# Patient Record
Sex: Female | Born: 1985 | Race: White | Hispanic: No | Smoking: Never smoker
Health system: Southern US, Community
[De-identification: ages and names within clinical notes are randomized; demographics above are authoritative.]

## PROBLEM LIST (undated history)

## (undated) DIAGNOSIS — C439 Malignant melanoma of skin, unspecified: Secondary | ICD-10-CM

## (undated) DIAGNOSIS — Q796 Ehlers-Danlos syndrome, unspecified: Secondary | ICD-10-CM

## (undated) DIAGNOSIS — F429 Obsessive-compulsive disorder, unspecified: Secondary | ICD-10-CM

## (undated) DIAGNOSIS — L80 Vitiligo: Secondary | ICD-10-CM

## (undated) DIAGNOSIS — E079 Disorder of thyroid, unspecified: Secondary | ICD-10-CM

## (undated) DIAGNOSIS — F32A Depression, unspecified: Secondary | ICD-10-CM

## (undated) HISTORY — PX: SKIN CANCER EXCISION: SHX779

## (undated) HISTORY — PX: REVISION OF SCAR ON FACE/HEAD: SHX2349

## (undated) HISTORY — DX: Vitiligo: L80

## (undated) HISTORY — PX: GALLBLADDER SURGERY: SHX652

## (undated) HISTORY — DX: Malignant melanoma of skin, unspecified: C43.9

## (undated) HISTORY — DX: Ehlers-Danlos syndrome, unspecified: Q79.60

## (undated) HISTORY — PX: FOOT SURGERY: SHX648

## (undated) HISTORY — DX: Obsessive-compulsive disorder, unspecified: F42.9

## (undated) HISTORY — PX: ABLATION: SHX5711

---

## 2017-01-15 ENCOUNTER — Ambulatory Visit
Admission: RE | Admit: 2017-01-15 | Discharge: 2017-01-15 | Disposition: A | Discharge status: Home / Self Care | Payer: BC Managed Care – PPO | Source: Ambulatory Visit | Attending: Family | Admitting: Family

## 2017-01-15 ENCOUNTER — Other Ambulatory Visit: Payer: Self-pay | Admitting: Family

## 2017-01-15 DIAGNOSIS — C439 Malignant melanoma of skin, unspecified: Secondary | ICD-10-CM

## 2018-06-20 ENCOUNTER — Other Ambulatory Visit: Payer: Self-pay | Admitting: Endocrinology

## 2018-06-20 DIAGNOSIS — E039 Hypothyroidism, unspecified: Secondary | ICD-10-CM

## 2018-06-21 ENCOUNTER — Other Ambulatory Visit: Payer: Self-pay | Admitting: Endocrinology

## 2018-07-02 ENCOUNTER — Ambulatory Visit
Admission: RE | Admit: 2018-07-02 | Discharge: 2018-07-02 | Disposition: A | Discharge status: Home / Self Care | Payer: BC Managed Care – PPO | Source: Ambulatory Visit | Attending: Endocrinology | Admitting: Endocrinology

## 2018-07-02 DIAGNOSIS — E039 Hypothyroidism, unspecified: Secondary | ICD-10-CM

## 2019-10-15 ENCOUNTER — Other Ambulatory Visit: Payer: Self-pay

## 2019-10-15 DIAGNOSIS — Z20822 Contact with and (suspected) exposure to covid-19: Secondary | ICD-10-CM

## 2019-10-16 LAB — NOVEL CORONAVIRUS, NAA: SARS-CoV-2, NAA: NOT DETECTED

## 2020-01-21 DIAGNOSIS — Q796 Ehlers-Danlos syndrome, unspecified: Secondary | ICD-10-CM | POA: Insufficient documentation

## 2020-06-09 ENCOUNTER — Ambulatory Visit: Payer: BC Managed Care – PPO | Attending: Internal Medicine

## 2020-06-09 DIAGNOSIS — Z20822 Contact with and (suspected) exposure to covid-19: Secondary | ICD-10-CM

## 2020-06-10 LAB — NOVEL CORONAVIRUS, NAA: SARS-CoV-2, NAA: NOT DETECTED

## 2020-06-10 LAB — SARS-COV-2, NAA 2 DAY TAT

## 2020-06-24 ENCOUNTER — Other Ambulatory Visit: Payer: BC Managed Care – PPO

## 2021-07-09 ENCOUNTER — Emergency Department (HOSPITAL_COMMUNITY): Payer: BC Managed Care – PPO

## 2021-07-09 ENCOUNTER — Other Ambulatory Visit: Payer: Self-pay

## 2021-07-09 ENCOUNTER — Encounter (HOSPITAL_COMMUNITY): Payer: Self-pay | Admitting: Emergency Medicine

## 2021-07-09 ENCOUNTER — Emergency Department (HOSPITAL_COMMUNITY)
Admission: EM | Admit: 2021-07-09 | Discharge: 2021-07-09 | Disposition: A | Discharge status: Home / Self Care | Payer: BC Managed Care – PPO | Attending: Emergency Medicine | Admitting: Emergency Medicine

## 2021-07-09 DIAGNOSIS — R202 Paresthesia of skin: Secondary | ICD-10-CM | POA: Diagnosis not present

## 2021-07-09 DIAGNOSIS — M542 Cervicalgia: Secondary | ICD-10-CM | POA: Diagnosis not present

## 2021-07-09 DIAGNOSIS — G44319 Acute post-traumatic headache, not intractable: Secondary | ICD-10-CM | POA: Diagnosis not present

## 2021-07-09 DIAGNOSIS — W208XXA Other cause of strike by thrown, projected or falling object, initial encounter: Secondary | ICD-10-CM | POA: Diagnosis not present

## 2021-07-09 DIAGNOSIS — R42 Dizziness and giddiness: Secondary | ICD-10-CM | POA: Diagnosis present

## 2021-07-09 HISTORY — DX: Depression, unspecified: F32.A

## 2021-07-09 HISTORY — DX: Disorder of thyroid, unspecified: E07.9

## 2021-07-09 LAB — CBC WITH DIFFERENTIAL/PLATELET
Abs Immature Granulocytes: 0.06 10*3/uL (ref 0.00–0.07)
Basophils Absolute: 0 10*3/uL (ref 0.0–0.1)
Basophils Relative: 0 %
Eosinophils Absolute: 0.1 10*3/uL (ref 0.0–0.5)
Eosinophils Relative: 1 %
HCT: 44.4 % (ref 36.0–46.0)
Hemoglobin: 15 g/dL (ref 12.0–15.0)
Immature Granulocytes: 0 %
Lymphocytes Relative: 21 %
Lymphs Abs: 3.2 10*3/uL (ref 0.7–4.0)
MCH: 30.9 pg (ref 26.0–34.0)
MCHC: 33.8 g/dL (ref 30.0–36.0)
MCV: 91.5 fL (ref 80.0–100.0)
Monocytes Absolute: 0.8 10*3/uL (ref 0.1–1.0)
Monocytes Relative: 6 %
Neutro Abs: 11 10*3/uL — ABNORMAL HIGH (ref 1.7–7.7)
Neutrophils Relative %: 72 %
Platelets: 417 10*3/uL — ABNORMAL HIGH (ref 150–400)
RBC: 4.85 MIL/uL (ref 3.87–5.11)
RDW: 12.8 % (ref 11.5–15.5)
WBC: 15.3 10*3/uL — ABNORMAL HIGH (ref 4.0–10.5)
nRBC: 0 % (ref 0.0–0.2)

## 2021-07-09 LAB — BASIC METABOLIC PANEL
Anion gap: 11 (ref 5–15)
BUN: 9 mg/dL (ref 6–20)
CO2: 22 mmol/L (ref 22–32)
Calcium: 9 mg/dL (ref 8.9–10.3)
Chloride: 104 mmol/L (ref 98–111)
Creatinine, Ser: 0.84 mg/dL (ref 0.44–1.00)
GFR, Estimated: >60 mL/min (ref >60)
Glucose, Bld: 129 mg/dL — ABNORMAL HIGH (ref 70–99)
Potassium: 3.5 mmol/L (ref 3.5–5.1)
Sodium: 137 mmol/L (ref 135–145)

## 2021-07-09 LAB — I-STAT BETA HCG BLOOD, ED (MC, WL, AP ONLY): I-stat hCG, quantitative: <5 m[IU]/mL (ref <5)

## 2021-07-09 MED ORDER — DIPHENHYDRAMINE HCL 25 MG PO CAPS
50.0000 mg | ORAL_CAPSULE | Freq: Once | ORAL | Status: AC
  Administered 2021-07-09: 50 mg via ORAL
  Filled 2021-07-09: qty 2

## 2021-07-09 MED ORDER — METOCLOPRAMIDE HCL 5 MG/ML IJ SOLN
10.0000 mg | Freq: Once | INTRAMUSCULAR | Status: AC
  Administered 2021-07-09: 10 mg via INTRAVENOUS
  Filled 2021-07-09: qty 2

## 2021-07-09 MED ORDER — SODIUM CHLORIDE 0.9 % IV BOLUS
1000.0000 mL | Freq: Once | INTRAVENOUS | Status: AC
  Administered 2021-07-09: 1000 mL via INTRAVENOUS

## 2021-07-09 MED ORDER — KETOROLAC TROMETHAMINE 15 MG/ML IJ SOLN
15.0000 mg | Freq: Once | INTRAMUSCULAR | Status: AC
  Administered 2021-07-09: 15 mg via INTRAVENOUS
  Filled 2021-07-09: qty 1

## 2021-07-09 MED ORDER — DEXAMETHASONE SODIUM PHOSPHATE 10 MG/ML IJ SOLN
10.0000 mg | Freq: Once | INTRAMUSCULAR | Status: AC
  Administered 2021-07-09: 10 mg via INTRAVENOUS
  Filled 2021-07-09: qty 1

## 2021-07-09 NOTE — ED Provider Notes (Signed)
Emergency Medicine Provider Triage Evaluation Note  April Salas , a 35 y.o. female  was evaluated in triage.  Pt complains of headache. Box of mason jars fell onto head on Thursday. Friday developed headache. "Off vision" to Bl eyes since. Tingling to BL arms L>R. No weakness. Neck went backwards during the injury. Feels like she is walking to the side. Recently completed abx of tonsillitis. No fever, neck rigidity, emesis. No hx of MS. Hx of thyroid and Ehlers danlos  Review of Systems  Positive: Headache, neck pain Negative: Cp, eesis  Physical Exam  BP (!) 123/92 (BP Location: Left Arm)   Pulse (!) 109   Temp 100 F (37.8 C) (Oral)   Resp 18   SpO2 96%  Gen:   Awake, no distress   Resp:  Normal effort  MSK:   Moves extremities without difficulty  Neuro:  Cn 2-12 grossly intact, equal strength, intact sensation  Other:    Medical Decision Making  Medically screening exam initiated at 3:04 PM.  Appropriate orders placed.  Adylee Lovern was informed that the remainder of the evaluation will be completed by another provider, this initial triage assessment does not replace that evaluation, and the importance of remaining in the ED until their evaluation is complete.  Headache, weakness, numbness  Not a code stroke, No LVO criteria   Hughes Wyndham A, PA-C 07/09/21 1507    Wyvonnia Dusky, MD 07/10/21 [phone]

## 2021-07-09 NOTE — ED Triage Notes (Signed)
Pt states mason jars fell on her head on Thursday night.  Reports headache and "fuzzy" vision since Friday.  Also reports L arm tingling.  No arm drift or change in sensation.

## 2021-07-09 NOTE — Discharge Instructions (Addendum)
Take Tylenol and/or ibuprofen as needed for pain. Avoid things that make your head worse, brain rest is the best thing you can do for your head right now. Follow-up with your primary care doctor for recheck of your symptoms. Return to the emergency room if develop severe worsening pain, vision changes, slurred speech, numbness, any new or worsening, concerning symptoms.

## 2021-07-09 NOTE — ED Notes (Signed)
Pt and her partner informed this nurse that pt does not want to have any more CT scans. Provider notified

## 2021-07-09 NOTE — ED Provider Notes (Signed)
[facility] EMERGENCY DEPARTMENT Provider Note   CSN: [id number] Arrival date & time: 07/09/21  1435     History No chief complaint on file.   April Salas is a 35 y.o. female presenting for evaluation of headache, difficulty focusing, dizziness, and tingling.  Patient states 2 days ago she was hit in the head with a heavy basket containing mason jars.  She had a mild headache at that time.  Yesterday her headache significantly worsened.  She has associated photophobia and states it is hard for her eyes to focus.  She reports feeling like both arms are weak and Jell-O like, with worsening symptoms on the left side.  She has pain in the upper aspect of her neck, but no pain in the lower part of the back.  No difficulty walking.  She has taken Motrin with mild improvement of headache, last dose was early this morning.  She is not on blood thinners.  Takes thyroid medication and depression medication daily.  She is currently being treated for mono and a tonsillar infection, on her last day of antibiotics.  HPI     Past Medical History:  Diagnosis Date   Depression    Thyroid disease     There are no problems to display for this patient.   History reviewed. No pertinent surgical history.   OB History   No obstetric history on file.     No family history on file.  Social History   Tobacco Use   Smoking status: Never   Smokeless tobacco: Never  Substance Use Topics   Alcohol use: Not Currently   Drug use: Not Currently    Home Medications Prior to Admission medications   Medication Sig Start Date End Date Taking? Authorizing Provider  azithromycin (ZITHROMAX) 250 MG tablet Take by mouth. 06/29/21   [provider]  clonazePAM (KLONOPIN) 0.5 MG tablet Take 0.5 mg by mouth 2 (two) times daily as needed. 06/10/21   [provider]  DULoxetine (CYMBALTA) 30 MG capsule Take by mouth. 06/13/21   [provider]  fluconazole  (DIFLUCAN) 150 MG tablet Take 150 mg by mouth daily. 07/05/21   [provider]  levothyroxine (SYNTHROID) 125 MCG tablet Take 125 mcg by mouth every morning. 06/01/21   [provider]  meloxicam (MOBIC) 15 MG tablet Take by mouth. 05/05/21   [provider]  Vitamin D, Ergocalciferol, (DRISDOL) 1.25 MG (50000 UNIT) CAPS capsule Take by mouth. 01/13/21   [provider]    Allergies    Patient has no allergy information on record.  Review of Systems   Review of Systems  Eyes:  Positive for photophobia and visual disturbance.  Neurological:  Positive for dizziness, weakness and headaches.  All other systems reviewed and are negative.  Physical Exam Updated Vital Signs BP 111/76   Pulse 75   Temp 100 F (37.8 C) (Oral)   Resp 16   SpO2 97%   Physical Exam Vitals and nursing note reviewed.  Constitutional:      General: She is not in acute distress.    Appearance: Normal appearance.     Comments: Appears nontoxic.  HENT:     Head: Normocephalic and atraumatic.  Eyes:     Conjunctiva/sclera: Conjunctivae normal.     Pupils: Pupils are equal, round, and reactive to light.     Comments: EOMI and PERRLA.  Photophobia noted.  Neck:     Comments: Tenderness palpation in the superior aspect of  the C-spine.  Full active range of motion of the head.  Mild discomfort with palpation of bilateral paracervical muscles. Cardiovascular:     Rate and Rhythm: Normal rate and regular rhythm.     Pulses: Normal pulses.  Pulmonary:     Effort: Pulmonary effort is normal. No respiratory distress.     Breath sounds: Normal breath sounds. No wheezing.     Comments: Speaking in full sentences.  Clear lung sounds in all fields. Abdominal:     General: There is no distension.     Palpations: Abdomen is soft. There is no mass.     Tenderness: There is no abdominal tenderness. There is no guarding or rebound.  Musculoskeletal:        General: Normal range of motion.      Cervical back: Normal range of motion and neck supple. Tenderness present.     Comments: Strength intact x4.  Skin:    General: Skin is warm and dry.     Capillary Refill: Capillary refill takes less than 2 seconds.  Neurological:     Mental Status: She is alert and oriented to person, place, and time.     GCS: GCS eye subscore is 4. GCS verbal subscore is 5. GCS motor subscore is 6.     Cranial Nerves: Cranial nerves are intact.     Motor: Motor function is intact.     Coordination: Finger-Nose-Finger Test normal.     Comments: CN intact.  Nose to finger intact.  Strength intact.  Patient reports abnormal sensation of the left hand and distal arm, equal sensation of the proximal arm  Psychiatric:        Mood and Affect: Mood and affect normal.        Speech: Speech normal.        Behavior: Behavior normal.    ED Results / Procedures / Treatments   Labs (all labs ordered are listed, but only abnormal results are displayed) Labs Reviewed  CBC WITH DIFFERENTIAL/PLATELET - Abnormal; Notable for the following components:      Result Value   WBC 15.3 (*)    Platelets 417 (*)    Neutro Abs 11.0 (*)    All other components within normal limits  BASIC METABOLIC PANEL - Abnormal; Notable for the following components:   Glucose, Bld 129 (*)    All other components within normal limits  I-STAT BETA HCG BLOOD, ED ([facility], [facility], [facility] ONLY)    EKG None  Radiology CT Head Wo Contrast  Result Date: 07/09/2021 CLINICAL DATA:  Headache and visual disturbance after mason jars fell on her head nights ago. EXAM: CT HEAD WITHOUT CONTRAST TECHNIQUE: Contiguous axial images were obtained from the base of the skull through the vertex without intravenous contrast. COMPARISON:  None. FINDINGS: Brain: Normal appearing cerebral hemispheres and posterior fossa structures. Normal size and position of the ventricles. No intracranial hemorrhage, mass lesion or CT evidence of acute infarction. Vascular: No  hyperdense vessel or unexpected calcification. Skull: Normal. Negative for fracture or focal lesion. Sinuses/Orbits: Mild inferior left maxillary sinus retention cyst formation. Unremarkable orbits. Other: None. IMPRESSION: No acute abnormality. Electronically Signed   By: Claudie Revering M.D.   On: 07/09/2021 15:54    Procedures Procedures   Medications Ordered in ED Medications  ketorolac (TORADOL) 15 MG/ML injection 15 mg (15 mg Intravenous Given 07/09/21 1709)  metoCLOPramide (REGLAN) injection 10 mg (10 mg Intravenous Given 07/09/21 1714)  dexamethasone (DECADRON) injection 10 mg (10 mg Intravenous  Given 07/09/21 1711)  diphenhydrAMINE (BENADRYL) capsule 50 mg (50 mg Oral Given 07/09/21 1717)  sodium chloride 0.9 % bolus 1,000 mL (0 mLs Intravenous Stopped 07/09/21 1824)    ED Course  I have reviewed the triage vital signs and the nursing notes.  Pertinent labs & imaging results that were available during my care of the patient were reviewed by me and considered in my medical decision making (see chart for details).    MDM Rules/Calculators/A&P                           Patient presenting for evaluation of headache and neck pain after a head injury a few days ago.  On exam, patient peers nontoxic.  She has no focal weakness.  She does report slight decrease sensation of the distal left arm.  However exam is not consistent with stroke, doubt LVO.  She does have neck tenderness, as such, obtain CT of the neck.  CT head obtained from triage negative for acute findings.  Labs overall reassuring.  There is mild leukocytosis, however patient is currently being treated for tonsil infection, likely contributing to this.  Heart rate is minimally elevated, likely due to low-grade fever of 100, dehydration, and pain.  After headache cocktail, heart rate has improved.  Patient is refusing CT of the neck, states she does not feel there is anything wrong with her neck.  I discussed risks including a  unstable C-spine fracture that could lead to paralysis, patient states he understands but does not want the CT.  I encouraged her to follow-up or return if she has worsening pain, numbness, or weakness.  Discussed symptoms of a concussion/posttraumatic headache.  Discussed brain rest.  At this time, patient appears safe for discharge.  Return precautions given.  Patient states she understands and agrees to plan.  Final Clinical Impression(s) / ED Diagnoses Final diagnoses:  Acute post-traumatic headache, not intractable    Rx / DC Orders ED Discharge Orders     None        Franchot Heidelberg, PA-C 07/09/21 1847    Fredia Sorrow, MD 07/14/21 1244

## 2021-07-29 DIAGNOSIS — B2790 Infectious mononucleosis, unspecified without complication: Secondary | ICD-10-CM | POA: Insufficient documentation

## 2021-07-29 HISTORY — DX: Infectious mononucleosis, unspecified without complication: B27.90

## 2021-08-18 ENCOUNTER — Telehealth: Payer: Self-pay | Admitting: Family Medicine

## 2021-09-22 ENCOUNTER — Encounter: Payer: BC Managed Care – PPO | Admitting: Family Medicine

## 2021-10-12 ENCOUNTER — Ambulatory Visit (INDEPENDENT_AMBULATORY_CARE_PROVIDER_SITE_OTHER): Payer: BC Managed Care – PPO | Admitting: Family Medicine

## 2021-10-12 ENCOUNTER — Encounter: Payer: Self-pay | Admitting: Family Medicine

## 2021-10-12 ENCOUNTER — Other Ambulatory Visit: Payer: Self-pay

## 2021-10-12 VITALS — BP 131/82 | HR 74 | Ht 66.0 in | Wt 226.0 lb

## 2021-10-12 DIAGNOSIS — M2550 Pain in unspecified joint: Secondary | ICD-10-CM

## 2021-10-12 DIAGNOSIS — Z803 Family history of malignant neoplasm of breast: Secondary | ICD-10-CM | POA: Diagnosis not present

## 2021-10-12 DIAGNOSIS — R102 Pelvic and perineal pain: Secondary | ICD-10-CM

## 2021-10-12 NOTE — Progress Notes (Signed)
Had a pap done in august at [facility]

## 2021-10-12 NOTE — Progress Notes (Signed)
   GYNECOLOGY PROBLEM  VISIT ENCOUNTER NOTE  Subjective:   April Salas is a 35 y.o. G1P0010 adult here for a problem GYN visit.  Current complaints:  Reports a longstanding right vaginal wall pain. Prior providers have recommend pelvic floor PT.    We reviewed in detail some of the experiences at the prior GYN provider office in terms of non-gender affirming care and also other BMI-shaming techniques and these prompted the change to our facility.   Denies abnormal vaginal bleeding but did have heavier cycles prior to ablation. Reports prostaglandin surge at typical menses time point which flares her Ehlers-Danlos.  Denies discharge, pelvic pain, problems with intercourse or other gynecologic concerns.    Patient is sexually active with non- sperm producing partner and has had ablation for bleeding control  Reviewed in a limited capacity change to mood stabilizing medication and some possible nerve overactivity. Recently increase duloxetine. Recommending reaching out to provider to discuss.   Gynecologic History No LMP recorded. Patient has had an ablation. Contraception: none  Health Maintenance Due  Topic Date Due   Pneumococcal Vaccine 66-18 Years old (1 - PCV) Never done   HIV Screening  Never done   Hepatitis C Screening  Never done   PAP SMEAR-Modifier  Never done    The following portions of the patient's history were reviewed and updated as appropriate: allergies, current medications, past family history, past medical history, past social history, past surgical history and problem list.  Review of Systems Pertinent items are noted in HPI.   Objective:  BP 131/82   Pulse 74   Ht 5\' 6"  (1.676 m)   Wt 226 lb (102.5 kg)   BMI 36.48 kg/m  Gen: well appearing, NAD HEENT: no scleral icterus CV: RR Lung: Normal WOB Ext: warm well perfused  PELVIC: not indicated   Assessment and Plan:   1. 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  Agrees to referral - Ambulatory referral to Physical Therapy  2. Family history of breast cancer Discussed EMPOWER panel testing and strongly desires. Multiple paternal side breast cancers (none 1st degree) and mother is adopted thus unsure of this side. Also patient has had melanoma - Genetic Screening  3. Joint Pain - Reviewed sx that center around prior menstrual cycle.  - Discussed Period Repair Manual by Montel Clock as possibly helpful   Please refer to After Visit Summary for other counseling recommendations.   Return if symptoms worsen or fail to improve.  Caren Macadam, MD, MPH, ABFM Attending [place] for [facility]

## 2021-10-14 ENCOUNTER — Encounter: Payer: Self-pay | Admitting: Family Medicine

## 2021-10-14 DIAGNOSIS — F4310 Post-traumatic stress disorder, unspecified: Secondary | ICD-10-CM | POA: Insufficient documentation

## 2021-10-14 DIAGNOSIS — E063 Autoimmune thyroiditis: Secondary | ICD-10-CM | POA: Insufficient documentation

## 2021-10-14 DIAGNOSIS — E559 Vitamin D deficiency, unspecified: Secondary | ICD-10-CM | POA: Insufficient documentation

## 2021-10-14 DIAGNOSIS — D0330 Melanoma in situ of unspecified part of face: Secondary | ICD-10-CM | POA: Insufficient documentation

## 2021-10-14 DIAGNOSIS — E039 Hypothyroidism, unspecified: Secondary | ICD-10-CM | POA: Insufficient documentation

## 2021-10-25 ENCOUNTER — Encounter: Payer: Self-pay | Admitting: Family Medicine

## 2021-10-25 ENCOUNTER — Telehealth: Payer: Self-pay

## 2021-10-25 NOTE — Telephone Encounter (Signed)
I left a message for the pt to call about the [employer] results and scanned them in [place] and will send a mychart message

## 2021-10-26 ENCOUNTER — Telehealth: Payer: Self-pay

## 2021-10-26 NOTE — Telephone Encounter (Signed)
Called pt about the results left a message

## 2021-12-11 ENCOUNTER — Emergency Department (HOSPITAL_COMMUNITY): Payer: BC Managed Care – PPO

## 2021-12-11 ENCOUNTER — Emergency Department (HOSPITAL_COMMUNITY)
Admission: EM | Admit: 2021-12-11 | Discharge: 2021-12-11 | Disposition: A | Discharge status: Home / Self Care | Payer: BC Managed Care – PPO | Attending: Emergency Medicine | Admitting: Emergency Medicine

## 2021-12-11 ENCOUNTER — Encounter (HOSPITAL_COMMUNITY): Payer: Self-pay | Admitting: Emergency Medicine

## 2021-12-11 ENCOUNTER — Other Ambulatory Visit: Payer: Self-pay

## 2021-12-11 DIAGNOSIS — D72829 Elevated white blood cell count, unspecified: Secondary | ICD-10-CM | POA: Insufficient documentation

## 2021-12-11 DIAGNOSIS — N12 Tubulo-interstitial nephritis, not specified as acute or chronic: Secondary | ICD-10-CM | POA: Insufficient documentation

## 2021-12-11 DIAGNOSIS — N83201 Unspecified ovarian cyst, right side: Secondary | ICD-10-CM | POA: Diagnosis not present

## 2021-12-11 DIAGNOSIS — R109 Unspecified abdominal pain: Secondary | ICD-10-CM | POA: Diagnosis present

## 2021-12-11 LAB — URINALYSIS, ROUTINE W REFLEX MICROSCOPIC
Bilirubin Urine: NEGATIVE
Glucose, UA: NEGATIVE mg/dL
Hgb urine dipstick: NEGATIVE
Ketones, ur: NEGATIVE mg/dL
Nitrite: NEGATIVE
Protein, ur: NEGATIVE mg/dL
Specific Gravity, Urine: 1.015 (ref 1.005–1.030)
pH: 7 (ref 5.0–8.0)

## 2021-12-11 LAB — BASIC METABOLIC PANEL
Anion gap: 12 (ref 5–15)
BUN: 9 mg/dL (ref 6–20)
CO2: 19 mmol/L — ABNORMAL LOW (ref 22–32)
Calcium: 9.4 mg/dL (ref 8.9–10.3)
Chloride: 105 mmol/L (ref 98–111)
Creatinine, Ser: 0.84 mg/dL (ref 0.44–1.00)
GFR, Estimated: >60 mL/min (ref >60)
Glucose, Bld: 127 mg/dL — ABNORMAL HIGH (ref 70–99)
Potassium: 4.1 mmol/L (ref 3.5–5.1)
Sodium: 136 mmol/L (ref 135–145)

## 2021-12-11 LAB — CBC WITH DIFFERENTIAL/PLATELET
Abs Immature Granulocytes: 0.05 10*3/uL (ref 0.00–0.07)
Basophils Absolute: 0.1 10*3/uL (ref 0.0–0.1)
Basophils Relative: 0 %
Eosinophils Absolute: 0.1 10*3/uL (ref 0.0–0.5)
Eosinophils Relative: 1 %
HCT: 44.3 % (ref 36.0–46.0)
Hemoglobin: 15.1 g/dL — ABNORMAL HIGH (ref 12.0–15.0)
Immature Granulocytes: 0 %
Lymphocytes Relative: 23 %
Lymphs Abs: 2.7 10*3/uL (ref 0.7–4.0)
MCH: 30.9 pg (ref 26.0–34.0)
MCHC: 34.1 g/dL (ref 30.0–36.0)
MCV: 90.6 fL (ref 80.0–100.0)
Monocytes Absolute: 0.8 10*3/uL (ref 0.1–1.0)
Monocytes Relative: 7 %
Neutro Abs: 7.9 10*3/uL — ABNORMAL HIGH (ref 1.7–7.7)
Neutrophils Relative %: 69 %
Platelets: 429 10*3/uL — ABNORMAL HIGH (ref 150–400)
RBC: 4.89 MIL/uL (ref 3.87–5.11)
RDW: 12.3 % (ref 11.5–15.5)
WBC: 11.5 10*3/uL — ABNORMAL HIGH (ref 4.0–10.5)
nRBC: 0 % (ref 0.0–0.2)

## 2021-12-11 LAB — I-STAT BETA HCG BLOOD, ED (MC, WL, AP ONLY): I-stat hCG, quantitative: <5 m[IU]/mL (ref <5)

## 2021-12-11 MED ORDER — LACTATED RINGERS IV BOLUS
1000.0000 mL | Freq: Once | INTRAVENOUS | Status: AC
  Administered 2021-12-11: 1000 mL via INTRAVENOUS

## 2021-12-11 MED ORDER — ONDANSETRON HCL 4 MG/2ML IJ SOLN
4.0000 mg | Freq: Once | INTRAMUSCULAR | Status: AC
  Administered 2021-12-11: 4 mg via INTRAVENOUS
  Filled 2021-12-11: qty 2

## 2021-12-11 MED ORDER — FLUCONAZOLE 150 MG PO TABS: ORAL_TABLET | ORAL | 0 refills | Status: DC

## 2021-12-11 MED ORDER — CEFDINIR 300 MG PO CAPS: 300.0000 mg | ORAL_CAPSULE | Freq: Two times a day (BID) | ORAL | 0 refills | Status: DC

## 2021-12-11 MED ORDER — SODIUM CHLORIDE 0.9 % IV SOLN
2.0000 g | Freq: Once | INTRAVENOUS | Status: AC
  Administered 2021-12-11: 2 g via INTRAVENOUS
  Filled 2021-12-11: qty 20

## 2021-12-11 MED ORDER — IOHEXOL 300 MG/ML  SOLN
100.0000 mL | Freq: Once | INTRAMUSCULAR | Status: AC | PRN
  Administered 2021-12-11: 100 mL via INTRAVENOUS

## 2021-12-11 MED ORDER — METOCLOPRAMIDE HCL 5 MG/ML IJ SOLN
5.0000 mg | Freq: Once | INTRAMUSCULAR | Status: AC
  Administered 2021-12-11: 5 mg via INTRAVENOUS
  Filled 2021-12-11: qty 2

## 2021-12-11 MED ORDER — ACETAMINOPHEN 325 MG PO TABS
650.0000 mg | ORAL_TABLET | Freq: Four times a day (QID) | ORAL | Status: DC | PRN
  Administered 2021-12-11: 650 mg via ORAL
  Filled 2021-12-11: qty 2

## 2021-12-11 MED ORDER — ONDANSETRON 4 MG PO TBDP: 4.0000 mg | ORAL_TABLET | Freq: Three times a day (TID) | ORAL | 0 refills | Status: DC | PRN

## 2021-12-11 MED ORDER — MORPHINE SULFATE (PF) 4 MG/ML IV SOLN
4.0000 mg | Freq: Once | INTRAVENOUS | Status: AC
  Administered 2021-12-11: 4 mg via INTRAVENOUS
  Filled 2021-12-11: qty 1

## 2021-12-11 NOTE — ED Triage Notes (Signed)
Taking Macrodantin for UTI since Tuesday.  Reports L flank pain x 2 days with nausea and vomiting.

## 2021-12-11 NOTE — ED Notes (Signed)
PO challenge successful. No n/v noted. No signs of distress

## 2021-12-11 NOTE — ED Provider Notes (Signed)
[facility] EMERGENCY DEPARTMENT Provider Note   CSN: [id number] Arrival date & time: 12/11/21  1028     History  Chief Complaint  Patient presents with   Flank Pain    April Salas is a 36 y.o. adult.  HPI Patient is a 36 year old female who presents to the emergency department due to left flank pain.  Patient states that she was initially experiencing dysuria as well as urinary frequency and was diagnosed with UTI 5 days ago.  She was started on Macrobid which she has been compliant with for the past 5 days.  She feels that her dysuria and urinary frequency has improved mildly but over the past 2 days began experiencing left flank pain as well as nausea, vomiting, and diarrhea last night.  States that the nausea, vomiting, and diarrhea have been persistent.  No hematemesis or hematochezia.  Reports a history of kidney infections in the past and feels that her current symptoms are similar.  Denies a history of kidney stones.    Home Medications Prior to Admission medications   Medication Sig Start Date End Date Taking? Authorizing Provider  cefdinir (OMNICEF) 300 MG capsule Take 1 capsule (300 mg total) by mouth 2 (two) times daily for 10 days. 12/11/21 12/21/21 Yes Rayna Sexton, PA-C  fluconazole (DIFLUCAN) 150 MG tablet Please take a single 150 mg dose of Diflucan if you begin experiencing a yeast infection.  You have been given a second dose to take 1 week later if your symptoms persist. 12/11/21  Yes Rayna Sexton, PA-C  ondansetron (ZOFRAN-ODT) 4 MG disintegrating tablet Take 1 tablet (4 mg total) by mouth every 8 (eight) hours as needed for nausea or vomiting. 12/11/21  Yes Rayna Sexton, PA-C  clonazePAM (KLONOPIN) 0.5 MG tablet Take 0.5 mg by mouth 2 (two) times daily as needed. 06/10/21   [provider]  DULoxetine (CYMBALTA) 30 MG capsule Take by mouth. 06/13/21   [provider]  lamoTRIgine (LAMICTAL) 100 MG tablet Take 100 mg by mouth  daily.    [provider]  levothyroxine (SYNTHROID) 125 MCG tablet Take 125 mcg by mouth every morning. 06/01/21   [provider]  Vitamin D, Ergocalciferol, (DRISDOL) 1.25 MG (50000 UNIT) CAPS capsule Take by mouth. 01/13/21   [provider]      Allergies    Hydrocodone    Review of Systems   Review of Systems  All other systems reviewed and are negative. Ten systems reviewed and are negative for acute change, except as noted in the HPI.   Physical Exam Updated Vital Signs BP 119/77 (BP Location: Right Arm)    Pulse 76    Temp 99.6 F (37.6 C) (Oral)    Resp 18    Ht 5\' 6"  (1.676 m)    Wt 99.8 kg    SpO2 98%    BMI 35.51 kg/m  Physical Exam Vitals and nursing note reviewed.  Constitutional:      General: April Salas is not in acute distress.    Appearance: Normal appearance. April Salas is not ill-appearing, toxic-appearing or diaphoretic.  HENT:     Head: Normocephalic and atraumatic.     Right Ear: External ear normal.     Left Ear: External ear normal.     Nose: Nose normal.     Mouth/Throat:     Mouth: Mucous membranes are moist.     Pharynx: Oropharynx is clear. No oropharyngeal exudate or posterior oropharyngeal erythema.  Eyes:  General: No scleral icterus.       Right eye: No discharge.        Left eye: No discharge.     Extraocular Movements: Extraocular movements intact.     Conjunctiva/sclera: Conjunctivae normal.  Cardiovascular:     Rate and Rhythm: Normal rate and regular rhythm.     Pulses: Normal pulses.     Heart sounds: Normal heart sounds. No murmur heard.   No friction rub. No gallop.  Pulmonary:     Effort: Pulmonary effort is normal. No respiratory distress.     Breath sounds: Normal breath sounds. No stridor. No wheezing, rhonchi or rales.  Abdominal:     General: Abdomen is flat.     Palpations: Abdomen is soft.     Tenderness: There is no abdominal tenderness. There is left CVA tenderness. There is no  right CVA tenderness.     Comments: Abdomen is soft and nontender.  No right CVA tenderness.  Moderate left CVA tenderness.  Musculoskeletal:        General: Normal range of motion.     Cervical back: Normal range of motion and neck supple. No tenderness.  Skin:    General: Skin is warm and dry.  Neurological:     General: No focal deficit present.     Mental Status: [name] is alert and oriented to person, place, and time.  Psychiatric:        Mood and Affect: Mood normal.        Behavior: Behavior normal.   ED Results / Procedures / Treatments   Labs (all labs ordered are listed, but only abnormal results are displayed) Labs Reviewed  BASIC METABOLIC PANEL - Abnormal; Notable for the following components:      Result Value   CO2 19 (*)    Glucose, Bld 127 (*)    All other components within normal limits  CBC WITH DIFFERENTIAL/PLATELET - Abnormal; Notable for the following components:   WBC 11.5 (*)    Hemoglobin 15.1 (*)    Platelets 429 (*)    Neutro Abs 7.9 (*)    All other components within normal limits  URINALYSIS, ROUTINE W REFLEX MICROSCOPIC - Abnormal; Notable for the following components:   APPearance HAZY (*)    Leukocytes,Ua MODERATE (*)    Bacteria, UA FEW (*)    All other components within normal limits  URINE CULTURE  I-STAT BETA HCG BLOOD, ED ([facility], [facility], [facility] ONLY)    EKG None  Radiology CT ABDOMEN PELVIS W CONTRAST  Result Date: 12/11/2021 CLINICAL DATA:  Left-sided flank pain. EXAM: CT ABDOMEN AND PELVIS WITH CONTRAST TECHNIQUE: Multidetector CT imaging of the abdomen and pelvis was performed using the standard protocol following bolus administration of intravenous contrast. CONTRAST:  175mL OMNIPAQUE IOHEXOL 300 MG/ML  SOLN COMPARISON:  None. FINDINGS: Lower chest: No acute abnormality. Hepatobiliary: No focal liver abnormality is seen. Status post cholecystectomy. No biliary dilatation. Pancreas: Unremarkable. No pancreatic ductal dilatation or  surrounding inflammatory changes. Spleen: Normal in size without focal abnormality. Adrenals/Urinary Tract: Adrenal glands are unremarkable. Kidneys are normal, without renal calculi, focal lesion, or hydronephrosis. Bladder is unremarkable. Stomach/Bowel: Stomach is within normal limits. Appendix appears normal. No evidence of bowel wall thickening, distention, or inflammatory changes. Vascular/Lymphatic: No significant vascular findings are present. No enlarged abdominal or pelvic lymph nodes. Reproductive: There is a 2.4 cm right adnexal cyst, likely related to the right ovary. Left ovary and uterus are grossly within normal limits. Other: No  abdominal wall hernia or abnormality. No abdominopelvic ascites. Musculoskeletal: No acute or significant osseous findings. IMPRESSION: 1. 2.4 cm right ovarian simple-appearing cyst. No follow-up imaging is recommended. Reference: JACR 2020 Feb;17(2):248-254 Electronically Signed   By: Ronney Asters M.D.   On: 12/11/2021 16:39    Procedures Procedures   Medications Ordered in ED Medications  acetaminophen (TYLENOL) tablet 650 mg (650 mg Oral Given 12/11/21 1550)  cefTRIAXone (ROCEPHIN) 2 g in sodium chloride 0.9 % 100 mL IVPB (0 g Intravenous Stopped 12/11/21 1640)  lactated ringers bolus 1,000 mL (0 mLs Intravenous Stopped 12/11/21 1715)  iohexol (OMNIPAQUE) 300 MG/ML solution 100 mL (100 mLs Intravenous Contrast Given 12/11/21 1632)  morphine 4 MG/ML injection 4 mg (4 mg Intravenous Given 12/11/21 1714)  ondansetron (ZOFRAN) injection 4 mg (4 mg Intravenous Given 12/11/21 1714)  metoCLOPramide (REGLAN) injection 5 mg (5 mg Intravenous Given 12/11/21 1856)    ED Course/ Medical Decision Making/ A&P Clinical Course as of 12/11/21 1914  Nancy Fetter Dec 11, 2021  1748 Patient reassessed.  Her pain is improved.  Still experiencing some nausea.  We will give patient a dose of Reglan and p.o. challenge her. [LJ]    Clinical Course User Index [LJ] Rayna Sexton, PA-C                            Medical Decision Making  Pt is a 36 y.o. adult who presents to the emergency department with what appears to be a developing pyelonephritis.  Patient diagnosed with a UTI 5 days ago and started on Macrobid.  She has been compliant with this medication.  She states that her dysuria and urinary frequency mostly resolved but she then began developing left flank pain as well as nausea and vomiting.  Labs: CBC with a white count of 11.5, hemoglobin of 15.1, platelets of 429, neutrophils of 7.9. BMP with a CO2 of 19, glucose of 127. I-STAT beta-hCG less than 5. UA with moderate leukocytes and few bacteria. Urine culture sent.  Imaging: CT scan of the abdomen/pelvis with contrast shows a 2.4 cm right ovarian simple appearing cyst.  No follow-up imaging is recommended.  I, Rayna Sexton, PA-C, personally reviewed and evaluated these images and lab results as part of my medical decision-making.  Patient with moderate left-sided flank pain.  No right flank pain.  UA continues to appear infectious with moderate leukocytes and few bacteria.  Culture sent.  Patient notes continued dysuria and urinary frequency.  CBC with a leukocytosis of 11.5.  Elevated hemoglobin at 15.1 and platelets of 429.  Likely a degree of hemoconcentration given patient's recent nausea and vomiting.  Exam, lab work, and history appear consistent with pyelonephritis.    Patient treated with IV fluids, morphine, Tylenol, as well as multiple antiemetics.  She states that "she is feeling much better".  She was successfully p.o. challenged.  CT did show a right ovarian cyst.  Patient states that she was aware of this.  Radiology does not recommend any follow-up imaging.  Patient is having no tenderness in the region.  Doubt torsion at this time.  Feel that the patient is stable for discharge at this time and she is agreeable.  Will discharge on a course of Zofran for breakthrough nausea/vomiting.  We will start  patient on a 10-day course of cefdinir.  Lastly, patient reports a history of yeast infections when taking antibiotics, so we will prescribe 2 doses of Diflucan to use as  needed if those symptoms should arise.  Patient given very strict return precautions and knows to return to the emergency department with any new or worsening symptoms.  Her questions were answered and she was amicable at the time of discharge.  Note: Portions of this report may have been transcribed using voice recognition software. Every effort was made to ensure accuracy; however, inadvertent computerized transcription errors may be present.   Final Clinical Impression(s) / ED Diagnoses Final diagnoses:  Pyelonephritis   Rx / DC Orders ED Discharge Orders          Ordered    ondansetron (ZOFRAN-ODT) 4 MG disintegrating tablet  Every 8 hours PRN        12/11/21 1911    cefdinir (OMNICEF) 300 MG capsule  2 times daily        12/11/21 1911    fluconazole (DIFLUCAN) 150 MG tablet        12/11/21 1911              Rayna Sexton, PA-C 12/11/21 1918    Valarie Merino, MD 12/13/21 1046

## 2021-12-11 NOTE — ED Provider Notes (Signed)
Emergency Medicine Provider Triage Evaluation Note  April Salas , a 36 y.o. adult  was evaluated in triage.  Pt complains of flank pain.  Was diagnosed with a urinary tract infection on Tuesday and has been taking Macrobid, felt like her urinary symptoms were improving but then she developed severe left-sided flank pain that has been persistent for the past 2 days.  Last night she started having vomiting and has been having persistent chills and a low-grade fever.  History of 1 prior kidney infection that felt similar.  No history of kidney stones.  Review of Systems  Positive: Flank pain, dysuria, chills, vomiting Negative: Abdominal pain, fever, chest pain, shortness of breath  Physical Exam  BP (!) 125/109 (BP Location: Right Arm)    Pulse 93    Temp 99.6 F (37.6 C) (Oral)    Resp 18    SpO2 94%  Gen:   Awake, no distress   Resp:  Normal effort  MSK:   Moves extremities without difficulty  Other:  Left CVA tenderness, no other abdominal tenderness noted  Medical Decision Making  Medically screening exam initiated at 11:01 AM.  Appropriate orders placed.  April Salas was informed that the remainder of the evaluation will be completed by another provider, this initial triage assessment does not replace that evaluation, and the importance of remaining in the ED until their evaluation is complete.     April Larsen, PA-C 12/11/21 1111    April Rank, MD 12/11/21 2113

## 2021-12-11 NOTE — Discharge Instructions (Addendum)
Like we discussed, I am concerned that you have developed a kidney infection on the left side.  I am going to prescribe you 3 medications for this.  I am prescribing you a medication called Zofran.  This is a disintegrating tablet you can use up to 3 times a day for management of your nausea and vomiting.  Please only take this as prescribed.  Please only take this if you are experiencing nausea and vomiting that you cannot control.  Second medication is called cefdinir.  This is an antibiotic.  Please begin taking this tomorrow morning.  I want you to take this twice a day for 10 days.  Do not stop taking this early.  Third medication is called Diflucan.  If you develop a yeast infection you can take a single 150 mg dose.  I will prescribe you a second dose if you need it to take 1 week later.  Please do not take the second dose unless your symptoms have persisted.  If you develop any new or worsening symptoms please come back to the emergency department immediately.

## 2021-12-12 LAB — URINE CULTURE: Culture: <10000 — AB

## 2021-12-16 ENCOUNTER — Other Ambulatory Visit: Payer: Self-pay

## 2021-12-16 ENCOUNTER — Emergency Department (HOSPITAL_COMMUNITY)
Admission: EM | Admit: 2021-12-16 | Discharge: 2021-12-16 | Discharge status: Against Medical Advice | Payer: BC Managed Care – PPO

## 2021-12-16 ENCOUNTER — Ambulatory Visit (HOSPITAL_COMMUNITY)
Admission: EM | Admit: 2021-12-16 | Discharge: 2021-12-16 | Disposition: A | Discharge status: Home / Self Care | Payer: BC Managed Care – PPO | Attending: Family Medicine | Admitting: Family Medicine

## 2021-12-16 ENCOUNTER — Encounter (HOSPITAL_COMMUNITY): Payer: Self-pay

## 2021-12-16 DIAGNOSIS — N12 Tubulo-interstitial nephritis, not specified as acute or chronic: Secondary | ICD-10-CM | POA: Diagnosis not present

## 2021-12-16 LAB — POCT URINALYSIS DIPSTICK, ED / UC
Bilirubin Urine: NEGATIVE
Glucose, UA: NEGATIVE mg/dL
Hgb urine dipstick: NEGATIVE
Ketones, ur: NEGATIVE mg/dL
Nitrite: NEGATIVE
Protein, ur: NEGATIVE mg/dL
Specific Gravity, Urine: 1.01 (ref 1.005–1.030)
Urobilinogen, UA: 0.2 mg/dL (ref 0.0–1.0)
pH: 5.5 (ref 5.0–8.0)

## 2021-12-16 MED ORDER — PROMETHAZINE HCL 25 MG RE SUPP: 25.0000 mg | Freq: Four times a day (QID) | RECTAL | 0 refills | Status: DC | PRN

## 2021-12-16 MED ORDER — CIPROFLOXACIN HCL 500 MG PO TABS: 500.0000 mg | ORAL_TABLET | Freq: Two times a day (BID) | ORAL | 0 refills | Status: DC

## 2021-12-16 MED ORDER — KETOROLAC TROMETHAMINE 30 MG/ML IJ SOLN
30.0000 mg | Freq: Once | INTRAMUSCULAR | Status: AC
  Administered 2021-12-16: 30 mg via INTRAMUSCULAR

## 2021-12-16 MED ORDER — CIPROFLOXACIN HCL 500 MG PO TABS
500.0000 mg | ORAL_TABLET | Freq: Two times a day (BID) | ORAL | 0 refills | Status: DC
Start: 2021-12-16 — End: 2021-12-16

## 2021-12-16 MED ORDER — KETOROLAC TROMETHAMINE 30 MG/ML IJ SOLN
INTRAMUSCULAR | Status: AC
  Filled 2021-12-16: qty 1

## 2021-12-16 MED ORDER — TRAMADOL HCL 50 MG PO TABS
50.0000 mg | ORAL_TABLET | Freq: Four times a day (QID) | ORAL | 0 refills | Status: DC | PRN
Start: 2021-12-16 — End: 2021-12-16

## 2021-12-16 MED ORDER — TRAMADOL HCL 50 MG PO TABS: 50.0000 mg | ORAL_TABLET | Freq: Four times a day (QID) | ORAL | 0 refills | Status: DC | PRN

## 2021-12-16 NOTE — Discharge Instructions (Addendum)
Stop cefdinir Cipro 500 mg 1 tab twice daily for 10 days. Tramadol 50 mg 1 every 6 hours as needed for pain Promethazine suppository 1 in rectum every 6 hours as needed for nausea/vomiting.  You have been given an injection of toradol for pain  Please go to the ER if you are worsening any, or not improving.

## 2021-12-16 NOTE — ED Triage Notes (Signed)
Pt states tx for kidney infection in ED on 01/01. Pt states lt flank pain worsen today and still having a low grade temp. States took advil around 4:30.

## 2021-12-16 NOTE — ED Provider Notes (Signed)
[place]    CSN: [id number] Arrival date & time: 12/16/21  1841      History   Chief Complaint Chief Complaint  Patient presents with   Flank Pain    HPI April Salas is a 36 y.o. adult.    Flank Pain  Here for worsening left flank pain, fever, and vomiting.  On 12/27 was seen by her pcp, and begun on macrobid for lower urinary symptoms. She worsened, and was seen in the ER 1/1. C/s neg then, but she had been on antibiotics still.  CT did not show stones or abscess or sign of pyelo at that time. Was changed to omnicef.  Then in the last few days has had more intense pain, still having fever and is vomiting some. 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 Authorizing Provider  ciprofloxacin (CIPRO) 500 MG tablet Take 1 tablet (500 mg total) by mouth 2 (two) times daily for 10 days. 12/16/21 12/26/21  Barrett Henle, MD  clonazePAM (KLONOPIN) 0.5 MG tablet Take 0.5 mg by mouth 2 (two) times daily as needed. 06/10/21    [provider]  DULoxetine (CYMBALTA) 30 MG capsule Take by mouth. 06/13/21   [provider]  fluconazole (DIFLUCAN) 150 MG tablet Please take a single 150 mg dose of Diflucan if you begin experiencing a yeast infection.  You have been given a second dose to take 1 week later if your symptoms persist. 12/11/21   Rayna Sexton, PA-C  lamoTRIgine (LAMICTAL) 100 MG tablet Take 100 mg by mouth daily.    [provider]  levothyroxine (SYNTHROID) 125 MCG tablet Take 125 mcg by mouth every morning. 06/01/21   [provider]  ondansetron (ZOFRAN-ODT) 4 MG disintegrating tablet Take 1 tablet (4 mg total) by mouth every 8 (eight) hours as needed for nausea or vomiting. 12/11/21   Rayna Sexton, PA-C  promethazine (PHENERGAN) 25 MG suppository Place 1 suppository (25 mg total) rectally every 6 (six) hours as needed for nausea or vomiting. 12/16/21   Barrett Henle, MD  traMADol (ULTRAM) 50 MG tablet Take 1  tablet (50 mg total) by mouth every 6 (six) hours as needed. 12/16/21   Barrett Henle, MD  Vitamin D, Ergocalciferol, (DRISDOL) 1.25 MG (50000 UNIT) CAPS capsule Take by mouth. 01/13/21   [provider]    Family History History reviewed. No pertinent family history.  Social History Social History   Tobacco Use   Smoking status: Never   Smokeless tobacco: Never  Substance Use Topics   Alcohol use: Not Currently   Drug use: Yes    Types: Marijuana     Allergies   Hydrocodone   Review of Systems Review of Systems  Genitourinary:  Positive for flank pain.    Physical Exam Triage Vital Signs ED Triage Vitals [12/16/21 1915]  Enc Vitals Group     BP 120/73     Pulse Rate 82     Resp (!) 99     Temp 98.3 F (36.8 C)     Temp Source Oral     SpO2 99 %     Weight      Height      Head Circumference      Peak Flow      Pain Score 8     Pain Loc      Pain Edu?      Excl. in [place]?    No data found.  Updated Vital Signs BP  120/73 (BP Location: Left Arm)    Pulse 82    Temp 98.3 F (36.8 C) (Oral)    Resp (!) 99    SpO2 99%   Visual Acuity Right Eye Distance:   Left Eye Distance:   Bilateral Distance:    Right Eye Near:   Left Eye Near:    Bilateral Near:     Physical Exam Constitutional:      General: April Salas is not in acute distress.    Appearance: April Salas is not toxic-appearing.  Eyes:     Extraocular Movements: Extraocular movements intact.     Pupils: Pupils are equal, round, and reactive to light.  Cardiovascular:     Rate and Rhythm: Regular rhythm.     Heart sounds: Normal heart sounds. No murmur heard. Abdominal:     Palpations: There is no mass.     Tenderness: There is abdominal tenderness (milder tenderness of left abd). There is left CVA tenderness.  Skin:    Capillary Refill: Capillary refill takes less than 2 seconds.     Coloration: Skin is not jaundiced or pale.  Neurological:     General: No focal deficit present.     Mental Status: April Salas is alert.  Psychiatric:        Behavior: Behavior normal.     UC Treatments / Results  Labs (all labs ordered are listed, but only abnormal results are displayed) Labs Reviewed  POCT URINALYSIS DIPSTICK, ED / UC - Abnormal; Notable for the following components:      Result Value   Leukocytes,Ua TRACE (*)    All other components within normal limits    EKG   Radiology No results found.  Procedures Procedures (including critical care time)  Medications Ordered in UC Medications  ketorolac (TORADOL) 30 MG/ML injection 30 mg (has no administration in time range)    Initial Impression / Assessment and Plan / UC Course  I have reviewed the triage vital signs and the nursing notes.  Pertinent labs & imaging results that were available during my care of the patient were reviewed by  me and considered in my medical decision making (see chart for details).     I asked pt to go to the ER, but she states  there is a 24 hour wait there. She is to proceed to the ER if she is worsening or not improving Final Clinical Impressions(s) / UC Diagnoses   Final diagnoses:  Pyelonephritis     Discharge Instructions      Stop cefdinir Cipro 500 mg 1 tab twice daily for 10 days. Tramadol 50 mg 1 every 6 hours as needed for pain Promethazine suppository 1 in rectum every 6 hours as needed for nausea/vomiting.  You have been given an injection of toradol for pain  Please go to the ER if you are worsening any, or not improving.     ED Prescriptions     Medication Sig Dispense Auth. Provider   ciprofloxacin (CIPRO) 500 MG tablet  (Status: Discontinued) Take 1 tablet (500 mg total) by mouth 2 (two) times daily for 10 days. 20 tablet Carena Stream, Gwenlyn Perking, MD   promethazine (PHENERGAN) 25 MG suppository  (Status: Discontinued) Place 1 suppository (25 mg total) rectally every 6 (six) hours as needed for nausea or vomiting. 4 each Barrett Henle, MD   traMADol (ULTRAM) 50 MG tablet  (Status: Discontinued) Take 1 tablet (50 mg total) by mouth every 6 (six) hours as needed. 10 tablet Barrett Henle, MD   traMADol (ULTRAM) 50 MG tablet Take 1 tablet (50 mg total) by mouth every 6 (six) hours as needed. 10 tablet Barrett Henle, MD   ciprofloxacin (CIPRO) 500 MG tablet Take 1 tablet (500 mg total) by mouth 2 (two) times daily for 10 days. 20 tablet Mikayah Joy, Gwenlyn Perking, MD   promethazine (PHENERGAN) 25 MG suppository Place 1 suppository (25 mg total) rectally every 6 (six) hours as needed for nausea or vomiting. 4 each Barrett Henle, MD      I have reviewed the PDMP during this encounter.   Barrett Henle, MD 12/16/21 1950

## 2021-12-16 NOTE — ED Notes (Signed)
Patient LWBS despite being told to stay. Dragged OTF.

## 2021-12-19 ENCOUNTER — Emergency Department (HOSPITAL_BASED_OUTPATIENT_CLINIC_OR_DEPARTMENT_OTHER)
Admission: EM | Admit: 2021-12-19 | Discharge: 2021-12-19 | Disposition: A | Discharge status: Home / Self Care | Payer: BC Managed Care – PPO | Attending: Emergency Medicine | Admitting: Emergency Medicine

## 2021-12-19 ENCOUNTER — Encounter (HOSPITAL_BASED_OUTPATIENT_CLINIC_OR_DEPARTMENT_OTHER): Payer: Self-pay | Admitting: Emergency Medicine

## 2021-12-19 ENCOUNTER — Other Ambulatory Visit: Payer: Self-pay

## 2021-12-19 ENCOUNTER — Emergency Department (HOSPITAL_BASED_OUTPATIENT_CLINIC_OR_DEPARTMENT_OTHER): Payer: BC Managed Care – PPO

## 2021-12-19 DIAGNOSIS — N730 Acute parametritis and pelvic cellulitis: Secondary | ICD-10-CM

## 2021-12-19 DIAGNOSIS — R102 Pelvic and perineal pain: Secondary | ICD-10-CM

## 2021-12-19 DIAGNOSIS — N739 Female pelvic inflammatory disease, unspecified: Secondary | ICD-10-CM | POA: Diagnosis not present

## 2021-12-19 DIAGNOSIS — R1032 Left lower quadrant pain: Secondary | ICD-10-CM | POA: Diagnosis present

## 2021-12-19 LAB — CBC
HCT: 43.9 % (ref 36.0–46.0)
Hemoglobin: 14.9 g/dL (ref 12.0–15.0)
MCH: 30.1 pg (ref 26.0–34.0)
MCHC: 33.9 g/dL (ref 30.0–36.0)
MCV: 88.7 fL (ref 80.0–100.0)
Platelets: 422 10*3/uL — ABNORMAL HIGH (ref 150–400)
RBC: 4.95 MIL/uL (ref 3.87–5.11)
RDW: 12.3 % (ref 11.5–15.5)
WBC: 12.1 10*3/uL — ABNORMAL HIGH (ref 4.0–10.5)
nRBC: 0 % (ref 0.0–0.2)

## 2021-12-19 LAB — URINALYSIS, ROUTINE W REFLEX MICROSCOPIC
Bilirubin Urine: NEGATIVE
Glucose, UA: NEGATIVE mg/dL
Hgb urine dipstick: NEGATIVE
Ketones, ur: NEGATIVE mg/dL
Nitrite: NEGATIVE
Protein, ur: NEGATIVE mg/dL
Specific Gravity, Urine: <1.005 — ABNORMAL LOW (ref 1.005–1.030)
pH: 5.5 (ref 5.0–8.0)

## 2021-12-19 LAB — BASIC METABOLIC PANEL
Anion gap: 10 (ref 5–15)
BUN: 11 mg/dL (ref 6–20)
CO2: 22 mmol/L (ref 22–32)
Calcium: 9.3 mg/dL (ref 8.9–10.3)
Chloride: 105 mmol/L (ref 98–111)
Creatinine, Ser: 0.78 mg/dL (ref 0.44–1.00)
GFR, Estimated: >60 mL/min (ref >60)
Glucose, Bld: 98 mg/dL (ref 70–99)
Potassium: 4.3 mmol/L (ref 3.5–5.1)
Sodium: 137 mmol/L (ref 135–145)

## 2021-12-19 LAB — WET PREP, GENITAL
Clue Cells Wet Prep HPF POC: NONE SEEN
Sperm: NONE SEEN
Trich, Wet Prep: NONE SEEN
WBC, Wet Prep HPF POC: >=10 — AB (ref <10)
Yeast Wet Prep HPF POC: NONE SEEN

## 2021-12-19 MED ORDER — PROMETHAZINE HCL 25 MG RE SUPP: 25.0000 mg | Freq: Four times a day (QID) | RECTAL | 1 refills | Status: DC | PRN

## 2021-12-19 MED ORDER — PROMETHAZINE HCL 25 MG/ML IJ SOLN
INTRAMUSCULAR | Status: AC
  Filled 2021-12-19: qty 1

## 2021-12-19 MED ORDER — MORPHINE SULFATE (PF) 4 MG/ML IV SOLN
4.0000 mg | Freq: Once | INTRAVENOUS | Status: AC
  Administered 2021-12-19: 4 mg via INTRAVENOUS
  Filled 2021-12-19: qty 1

## 2021-12-19 MED ORDER — SODIUM CHLORIDE 0.9 % IV SOLN
12.5000 mg | Freq: Four times a day (QID) | INTRAVENOUS | Status: DC | PRN
  Administered 2021-12-19: 12.5 mg via INTRAVENOUS
  Filled 2021-12-19: qty 0.5

## 2021-12-19 MED ORDER — FENTANYL CITRATE PF 50 MCG/ML IJ SOSY
50.0000 ug | PREFILLED_SYRINGE | Freq: Once | INTRAMUSCULAR | Status: AC
Start: 2021-12-19 — End: 2021-12-19
  Administered 2021-12-19: 50 ug via INTRAVENOUS
  Filled 2021-12-19: qty 1

## 2021-12-19 MED ORDER — CEFTRIAXONE SODIUM 500 MG IJ SOLR
500.0000 mg | Freq: Once | INTRAMUSCULAR | Status: AC
  Administered 2021-12-19: 500 mg via INTRAMUSCULAR
  Filled 2021-12-19: qty 500

## 2021-12-19 MED ORDER — DOXYCYCLINE HYCLATE 100 MG PO CAPS: 100.0000 mg | ORAL_CAPSULE | Freq: Two times a day (BID) | ORAL | 0 refills | Status: DC

## 2021-12-19 MED ORDER — METRONIDAZOLE 500 MG PO TABS: 500.0000 mg | ORAL_TABLET | Freq: Two times a day (BID) | ORAL | 0 refills | Status: DC

## 2021-12-19 MED ORDER — LIDOCAINE HCL (PF) 1 % IJ SOLN
INTRAMUSCULAR | Status: AC
  Filled 2021-12-19: qty 5

## 2021-12-19 MED ORDER — METHOCARBAMOL 500 MG PO TABS: 500.0000 mg | ORAL_TABLET | Freq: Two times a day (BID) | ORAL | 0 refills | Status: DC | PRN

## 2021-12-19 MED ORDER — LACTATED RINGERS IV BOLUS
1000.0000 mL | Freq: Once | INTRAVENOUS | Status: AC
  Administered 2021-12-19: 1000 mL via INTRAVENOUS

## 2021-12-19 NOTE — ED Provider Notes (Addendum)
April Salas Provider Note   CSN: [id number] Arrival date & time: 12/19/21  1113     History Chief Complaint  Patient presents with   Pyelonephritis    April Salas is a 36 y.o. adult presents the emergency department for evaluation of left flank/LLQ pain for the past almost two weeks.  Additionally, she has been having some nausea and vomiting.  In the beginning, she was seen in the PCP office hydration of some dysuria and the same left flank also lower quadrant pain and was put on Macrobid.  She reports her urinary symptoms improved but she still had the left-sided flank pain that was persistent.  She presented to the emergency department and obtain a CT scan of her abdomen pelvis which showed a right ovarian cyst, but no evidence of renal stone.  She was placed on cefdinir and given fluconazole and Zofran as well.  She reports her pain was still present even with compliancy given antibiotics.  She presented back to the urgent care on 12-16-21 for evaluation and was put on Cipro and given tramadol for pain as well as given Phenergan for her vomiting.  Currently, she has not had any vomiting.  But is still experiencing some nausea.  She denies any dysuria or hematuria.  She denies any diarrhea or constipation.  She denies any vaginal bleeding or abnormal vaginal discharge.  Denies any urinary urgency or frequency.  She reports she is taken tramadol with no relief of pain.  She reports the pain is constant and squeezing in the left flank and is worse with movement.  Medical history includes EDS, PTSD, OCD, anxiety, and hypothyroidism.  Surgical history includes uterine ablation, cholecystectomy, and facial reconstruction surgery.  Is on Synthroid, Lamictal, duloxetine, vitamin B, turmeric and clonazepam as needed.  She reports her allergy to hydrocodone is not really an allergy just makes her nauseous.  Denies any tobacco, EtOH, or illicit drug use ever.  HPI    Home  Medications Prior to Admission medications   Medication Sig Start Date End Date Taking? Authorizing Provider  doxycycline (VIBRAMYCIN) 100 MG capsule Take 1 capsule (100 mg total) by mouth 2 (two) times daily. 12/19/21  Yes Sherrell Puller, PA-C  methocarbamol (ROBAXIN) 500 MG tablet Take 1 tablet (500 mg total) by mouth 2 (two) times daily as needed for muscle spasms. 12/19/21  Yes Sherrell Puller, PA-C  metroNIDAZOLE (FLAGYL) 500 MG tablet Take 1 tablet (500 mg total) by mouth 2 (two) times daily. 12/19/21  Yes Sherrell Puller, PA-C  clonazePAM (KLONOPIN) 0.5 MG tablet Take 0.5 mg by mouth 2 (two) times daily as needed. 06/10/21   [provider]  DULoxetine (CYMBALTA) 30 MG capsule Take by mouth. 06/13/21   [provider]  fluconazole (DIFLUCAN) 150 MG tablet Please take a single 150 mg dose of Diflucan if you begin experiencing a yeast infection.  You have been given a second dose to take 1 week later if your symptoms persist. 12/11/21   Rayna Sexton, PA-C  lamoTRIgine (LAMICTAL) 100 MG tablet Take 100 mg by mouth daily.    [provider]  levothyroxine (SYNTHROID) 125 MCG tablet Take 125 mcg by mouth every morning. 06/01/21   [provider]  ondansetron (ZOFRAN-ODT) 4 MG disintegrating tablet Take 1 tablet (4 mg total) by mouth every 8 (eight) hours as needed for nausea or vomiting. 12/11/21   Rayna Sexton, PA-C  promethazine (PHENERGAN) 25 MG suppository Place 1 suppository (25 mg total) rectally every 6 (  six) hours as needed for nausea or vomiting. 12/19/21   Sherrell Puller, PA-C  traMADol (ULTRAM) 50 MG tablet Take 1 tablet (50 mg total) by mouth every 6 (six) hours as needed. 12/21/21   Caren Macadam, MD  Vitamin D, Ergocalciferol, (DRISDOL) 1.25 MG (50000 UNIT) CAPS capsule Take by mouth. 01/13/21   [provider]      Allergies    Hydrocodone and Hydrocodone-acetaminophen    Review of Systems   Review of Systems  Constitutional:  Negative for  chills and fever.  HENT:  Negative for ear pain and sore throat.   Eyes:  Negative for pain and visual disturbance.  Respiratory:  Negative for cough and shortness of breath.   Cardiovascular:  Negative for chest pain and palpitations.  Gastrointestinal:  Positive for abdominal pain, nausea and vomiting. Negative for anal bleeding, blood in stool, constipation and diarrhea.  Genitourinary:  Positive for flank pain. Negative for dysuria, hematuria, vaginal bleeding and vaginal discharge.  Musculoskeletal:  Negative for arthralgias and back pain.  Skin:  Negative for color change and rash.  Neurological:  Negative for seizures and syncope.  All other systems reviewed and are negative.  Physical Exam Updated Vital Signs BP 112/74 (BP Location: Right Arm)    Pulse 81    Temp 98.8 F (37.1 C) (Oral)    Resp 16    Ht 5\' 6"  (1.676 m)    Wt 99.8 kg    SpO2 98%    BMI 35.51 kg/m  Physical Exam Vitals and nursing note reviewed. Exam conducted with a chaperone present William Hamburger, Therapist, sports).  Constitutional:      Appearance: Normal appearance. [name] is not toxic-appearing.     Comments: Appears uncomfortable but not toxic.  HENT:     Head: Normocephalic and atraumatic.  Eyes:     General: No scleral icterus. Cardiovascular:     Rate and Rhythm: Normal rate and regular rhythm.  Pulmonary:     Effort: Pulmonary effort is normal. No respiratory distress.     Breath sounds: Normal breath sounds.  Abdominal:     General: Bowel sounds are normal. There is no distension.     Palpations: Abdomen is soft.     Tenderness: There is abdominal tenderness. There is left CVA tenderness. There is no right CVA tenderness, guarding or rebound.     Comments: Abdominal exam limited secondary to body habitus.  No overlying skin changes other than faint faded striae.  No rashes, erythema, ecchymosis, or abrasion noted to the abdomen or flank.  Tenderness palpation of the left periumbilical and left lower quadrant.   No guarding or rebounding.  Negative Rovsing's.  Active bowel sounds.  Genitourinary:    Exam position: Lithotomy position.     Pubic Area: No rash.      Labia:        Right: No rash, tenderness, lesion or injury.        Left: No rash, tenderness, lesion or injury.      Vagina: Normal.     Cervix: Cervical motion tenderness and discharge present. No friability.     Comments: Moderate amount of thin white discharge in the vaginal canal.  Some CMT present.  No cervical friability or erythema present.  No bleeding noted. Musculoskeletal:        General: No deformity.     Cervical back: Normal range of motion.  Skin:    General: Skin is warm and dry.  Neurological:  General: No focal deficit present.     Mental Status: Jaimee Corum is alert. Mental status is at baseline.    ED Results / Procedures / Treatments   Labs (all labs ordered are listed, but only abnormal results are displayed) Labs Reviewed  WET PREP, GENITAL - Abnormal; Notable for the following components:      Result Value   WBC, Wet Prep HPF POC >=10 (*)    All other components within normal limits  CBC - Abnormal; Notable for the following components:   WBC 12.1 (*)    Platelets 422 (*)    All other components within normal limits  URINALYSIS, ROUTINE W REFLEX MICROSCOPIC - Abnormal; Notable for the following components:   Color, Urine COLORLESS (*)    Specific Gravity, Urine <1.005 (*)    Leukocytes,Ua SMALL (*)    Bacteria, UA MANY (*)    All other components within normal limits  URINE CULTURE  BASIC METABOLIC PANEL  GC/CHLAMYDIA PROBE AMP ([place]) NOT AT [facility]    EKG None  Radiology US PELVIC COMPLETE W TRANSVAGINAL AND TORSION R/O  Result Date: 12/19/2021 CLINICAL DATA:  Vaginal discharge with left flank pain. No menses since endometrial ablation. Patient also has history of pyelonephritis. EXAM: TRANSABDOMINAL AND TRANSVAGINAL ULTRASOUND OF PELVIS TECHNIQUE: Both transabdominal and  transvaginal ultrasound examinations of the pelvis were performed. Transabdominal technique was performed for global imaging of the pelvis including uterus, ovaries, adnexal regions, and pelvic cul-de-sac. It was necessary to proceed with endovaginal exam following the transabdominal exam to visualize the endometrium. COMPARISON:  None FINDINGS: Uterus Measurements: 6.4 x 2.0 x 3.2 = volume: 21.8 mL. No fibroids or other mass visualized. Endometrium Thickness: 3 mm.  No focal abnormality visualized. Right ovary Measurements: 4.0 x 3.7 x 3.8 = volume: 29.4 mL. Anechoic avascular cyst with thin linear echoes measuring 3.0 x 2.8 x 3.0 cm, likely representing a complex cyst or hemorrhagic cyst. Left ovary Not visualized. Other findings No abnormal free fluid. IMPRESSION: Normal uterus with normal endometrium. Left ovary not visualized.  Complex cyst in the right ovary. Electronically Signed   By: Keane Police D.O.   On: 12/19/2021 15:02    Procedures Procedures   Medications Ordered in ED Medications  lactated ringers bolus 1,000 mL (0 mLs Intravenous Stopped 12/19/21 1530)  morphine 4 MG/ML injection 4 mg (4 mg Intravenous Given 12/19/21 1256)  fentaNYL (SUBLIMAZE) injection 50 mcg (50 mcg Intravenous Given 12/19/21 1546)  cefTRIAXone (ROCEPHIN) injection 500 mg (500 mg Intramuscular Given 12/19/21 1644)    ED Course/ Medical Decision Making/ A&P                           Medical Decision Making  36 year old female presents emergency department with her evaluation of left flank/left lower quadrant pain for the past 2 weeks.  Differential diagnosis includes but is not limited to pelvic inflammatory disease, ovarian torsion, pelvic etiology, constipation, musculoskeletal, UTI, renal stone.  Vital signs are unremarkable. Patient afebrile normotensive.  No concern for sepsis.  On physical exam, the patient is tender to touch of her left flank and left periumbilical/left lower quadrant.  No overlying skin changes  noted.  Normal active bowel sounds.  No step-offs or deformities noted. The patient denies any trauma to the area.  Pelvic exam performed with chaperone present and showed moderate amount of thin white discharge in the vaginal canal.  CMT tenderness present.  No friability or erythema seen  to the cervix.  No bleeding present.  Phenergan and fluids ordered.  Labs and ultrasound order placed.  Morphine and fentanyl ordered for pain.  BMP normal.  No electrode abnormalities.  CBC shows mildly elevated white blood cell count at 12.1.  No signs of anemia.  Slightly increased platelets of 422.  Urinalysis shows diluted colorless urine with small leuks present and many bacteria present although 0-5 white blood cells seen.  Her previous UA from a few days ago in the ER she had hazy urine with moderate amount leuks and few bacteria.  Wet prep shows greater than 10 white blood cells with no clue cells, yeast, trichomoniasis seen.  GC pending urine culture pending. US shows normal uterus with normal endometrium. Left ovary not visualized.  Complex cyst in the right ovary.  Low suspicion of ovarian torsion at this time as for the pain has been going on for almost 2 weeks.  Urinalysis not consistent with UTI.  P.o. challenge presented and patient passed without emesis or nausea.  Cyst seen in the right ovary on previous abdominal CT.  Abdominal CT from 12/11/21 shows complex right ovarian cyst, otherwise normal.  The amount of white discharge in the vaginal canal as well as CMT.  Likely PID.  I discussed this with my attending who reviewed the CT scan from the previous visit.  Her pain is likely due to PID.  We will give her dose of Rocephin here and send her home with doxycycline and metronidazole.  Additionally, she might have some MSK pain with the tenderness with movement and tenderness palpation of her flank, will also give her Robaxin for muscle laxer's.  I urged her that she can take ibuprofen medication with her  tramadol for pain relief.  Recommended drinking plenty of fluids, mainly water to stay well-hydrated.  Recommended plenty of rest.  Strict return precautions discussed.  Recommended follow-up with her PCP.  Patient agrees with plan.  Patient is stable be discharged home in good condition.  I discussed this case with my attending physician who cosigned this note including patient's presenting symptoms, physical exam, and planned diagnostics and interventions. Attending physician stated agreement with plan or made changes to plan which were implemented.   Final Clinical Impression(s) / ED Diagnoses Final diagnoses:  PID (acute pelvic inflammatory disease)    Rx / DC Orders ED Discharge Orders          Ordered    promethazine (PHENERGAN) 25 MG suppository  Every 6 hours PRN        12/19/21 1706    doxycycline (VIBRAMYCIN) 100 MG capsule  2 times daily        12/19/21 1706    metroNIDAZOLE (FLAGYL) 500 MG tablet  2 times daily        12/19/21 1706    methocarbamol (ROBAXIN) 500 MG tablet  2 times daily PRN        12/19/21 1706              Sherrell Puller, PA-C 12/21/21 1507    Sherrell Puller, PA-C 12/21/21 1508    Margette Fast, MD 12/21/21 1705

## 2021-12-19 NOTE — ED Notes (Signed)
Patient transported to US 

## 2021-12-19 NOTE — Discharge Instructions (Addendum)
You were seen in today for evaluation of your worsening left flank/left abdomen pain.  Your urine does not show signs of UTI nor does your lab work indicate any injury to your kidneys.  A pelvic exam was performed and showed that she had white blood cells and bacteria seen in your vaginal canal consistent with pelvic inflammatory disease.  I know that you were placed on previous antibiotics for your UTI, however these were not effective in treating PID.  I have prescribed you doxycycline and metronidazole, 2 antibiotics, to take for the next 10 days.  Please adhere to the prescription and take completely as directed.  Do not drink while on this medication as it can cause severe abdominal discomfort and vomiting.  I have refilled your Phenergan suppositories to take as needed.  You can use your existing tramadol and take ibuprofen 600 mg on top of this as well. In addition, I have prescribed you a muscle relaxer called Robaxin.  Please do not take if operating heavy machinery or driving as it can make you sleepy.  If you have any fevers, worsening abdominal pain, blood in your urine, dark or tarry stools, uncontrolled vomiting, please return to the nearest emergency department for reevaluation.

## 2021-12-19 NOTE — ED Notes (Signed)
Patient tolerated nourishment provided, denies NV when asked,

## 2021-12-19 NOTE — ED Triage Notes (Signed)
Pt arrives to ED with c/o pyelonephritis. Pt reports having worsening left flank pain and continued fever. Pt was originally dx with pyelonephritis on 12/27 per her PCP. Pt is currently on her third antibiotic including Macrobid, Omicef, Cipro.

## 2021-12-20 ENCOUNTER — Encounter: Payer: Self-pay | Admitting: Family Medicine

## 2021-12-20 LAB — GC/CHLAMYDIA PROBE AMP (~~LOC~~) NOT AT ARMC
Chlamydia: NEGATIVE
Comment: NEGATIVE
Comment: NORMAL
Neisseria Gonorrhea: NEGATIVE

## 2021-12-20 LAB — URINE CULTURE: Culture: NO GROWTH

## 2021-12-21 ENCOUNTER — Other Ambulatory Visit: Payer: Self-pay | Admitting: Family Medicine

## 2021-12-21 MED ORDER — TRAMADOL HCL 50 MG PO TABS: 50.0000 mg | ORAL_TABLET | Freq: Four times a day (QID) | ORAL | 0 refills | Status: DC | PRN

## 2022-03-10 ENCOUNTER — Other Ambulatory Visit: Payer: Self-pay

## 2022-03-11 ENCOUNTER — Other Ambulatory Visit: Payer: Self-pay

## 2022-03-11 ENCOUNTER — Emergency Department (HOSPITAL_BASED_OUTPATIENT_CLINIC_OR_DEPARTMENT_OTHER): Payer: BC Managed Care – PPO

## 2022-03-11 ENCOUNTER — Encounter (HOSPITAL_BASED_OUTPATIENT_CLINIC_OR_DEPARTMENT_OTHER): Payer: Self-pay | Admitting: *Deleted

## 2022-03-11 ENCOUNTER — Emergency Department (HOSPITAL_BASED_OUTPATIENT_CLINIC_OR_DEPARTMENT_OTHER)
Admission: EM | Admit: 2022-03-11 | Discharge: 2022-03-11 | Disposition: A | Discharge status: Home / Self Care | Payer: BC Managed Care – PPO | Attending: Emergency Medicine | Admitting: Emergency Medicine

## 2022-03-11 DIAGNOSIS — R0602 Shortness of breath: Secondary | ICD-10-CM | POA: Diagnosis present

## 2022-03-11 DIAGNOSIS — Z8616 Personal history of COVID-19: Secondary | ICD-10-CM | POA: Diagnosis not present

## 2022-03-11 LAB — CBC WITH DIFFERENTIAL/PLATELET
Abs Immature Granulocytes: 0.09 10*3/uL — ABNORMAL HIGH (ref 0.00–0.07)
Basophils Absolute: 0 10*3/uL (ref 0.0–0.1)
Basophils Relative: 0 %
Eosinophils Absolute: 0.1 10*3/uL (ref 0.0–0.5)
Eosinophils Relative: 1 %
HCT: 41.6 % (ref 36.0–46.0)
Hemoglobin: 14.6 g/dL (ref 12.0–15.0)
Immature Granulocytes: 1 %
Lymphocytes Relative: 26 %
Lymphs Abs: 3.4 10*3/uL (ref 0.7–4.0)
MCH: 30.3 pg (ref 26.0–34.0)
MCHC: 35.1 g/dL (ref 30.0–36.0)
MCV: 86.3 fL (ref 80.0–100.0)
Monocytes Absolute: 1 10*3/uL (ref 0.1–1.0)
Monocytes Relative: 8 %
Neutro Abs: 8.3 10*3/uL — ABNORMAL HIGH (ref 1.7–7.7)
Neutrophils Relative %: 64 %
Platelets: 383 10*3/uL (ref 150–400)
RBC: 4.82 MIL/uL (ref 3.87–5.11)
RDW: 12.9 % (ref 11.5–15.5)
WBC: 13 10*3/uL — ABNORMAL HIGH (ref 4.0–10.5)
nRBC: 0 % (ref 0.0–0.2)

## 2022-03-11 LAB — BASIC METABOLIC PANEL
Anion gap: 13 (ref 5–15)
BUN: 13 mg/dL (ref 6–20)
CO2: 19 mmol/L — ABNORMAL LOW (ref 22–32)
Calcium: 10.2 mg/dL (ref 8.9–10.3)
Chloride: 105 mmol/L (ref 98–111)
Creatinine, Ser: 0.83 mg/dL (ref 0.44–1.00)
GFR, Estimated: >60 mL/min (ref >60)
Glucose, Bld: 103 mg/dL — ABNORMAL HIGH (ref 70–99)
Potassium: 3.7 mmol/L (ref 3.5–5.1)
Sodium: 137 mmol/L (ref 135–145)

## 2022-03-11 LAB — TROPONIN I (HIGH SENSITIVITY)
Troponin I (High Sensitivity): <2 ng/L (ref <18)
Troponin I (High Sensitivity): <2 ng/L (ref <18)

## 2022-03-11 LAB — BRAIN NATRIURETIC PEPTIDE: B Natriuretic Peptide: 8.2 pg/mL (ref 0.0–100.0)

## 2022-03-11 MED ORDER — ACETAMINOPHEN 325 MG PO TABS
650.0000 mg | ORAL_TABLET | Freq: Once | ORAL | Status: AC
  Administered 2022-03-11: 650 mg via ORAL
  Filled 2022-03-11: qty 2

## 2022-03-11 MED ORDER — ALBUTEROL SULFATE HFA 108 (90 BASE) MCG/ACT IN AERS: 2.0000 | INHALATION_SPRAY | RESPIRATORY_TRACT | Status: DC | PRN

## 2022-03-11 MED ORDER — IOHEXOL 350 MG/ML SOLN
100.0000 mL | Freq: Once | INTRAVENOUS | Status: AC | PRN
Start: 2022-03-11 — End: 2022-03-11
  Administered 2022-03-11: 77 mL via INTRAVENOUS

## 2022-03-11 NOTE — ED Triage Notes (Signed)
Pt is here for sob and chest pain.  Pt had covid recently and she saw PCP yesterday and she wanted pt to have a CTA to r/o PE but something went wrong and it was not scheduled ?

## 2022-03-11 NOTE — ED Notes (Signed)
ED Provider at bedside. 

## 2022-03-11 NOTE — Discharge Instructions (Signed)
Call your primary care doctor or specialist as discussed in the next 2-3 days.   Return immediately back to the ER if:  Your symptoms worsen within the next 12-24 hours. You develop new symptoms such as new fevers, persistent vomiting, new pain, shortness of breath, or new weakness or numbness, or if you have any other concerns.  

## 2022-03-11 NOTE — ED Provider Notes (Signed)
?[place] [facility] ?Provider Note ? ? ?CSN: [id number] ?Arrival date & time: 03/11/22  1841 ? ?  ? ?History ? ?Chief Complaint  ?Patient presents with  ? Shortness of Breath  ? ? ?April Salas is a 36 y.o. adult. ? ?Patient with history of Ehlers-Danlos syndrome, presents with chest pain shortness of breath episodes ever since she had COVID about a month ago.  She saw her primary care doctor who scheduled her for an outpatient CT angio to rule out pulm embolism but she was unable to complete this on an outpatient basis and presents to the ER.  Denies any fevers denies any new cough.  No vomiting or diarrhea.  She was diagnosed with COVID over a month ago but still has intermittent symptoms. ? ? ?  ? ?Home Medications ?Prior to Admission medications   ?Medication Sig Start Date End Date Taking? Authorizing Provider  ?clonazePAM (KLONOPIN) 0.5 MG tablet Take 0.5 mg by mouth 2 (two) times daily as needed. 06/10/21   [provider]  ?doxycycline (VIBRAMYCIN) 100 MG capsule Take 1 capsule (100 mg total) by mouth 2 (two) times daily. 12/19/21   Sherrell Puller, PA-C  ?DULoxetine (CYMBALTA) 30 MG capsule Take by mouth. 06/13/21   [provider]  ?fluconazole (DIFLUCAN) 150 MG tablet Please take a single 150 mg dose of Diflucan if you begin experiencing a yeast infection.  You have been given a second dose to take 1 week later if your symptoms persist. 12/11/21   Rayna Sexton, PA-C  ?lamoTRIgine (LAMICTAL) 100 MG tablet Take 100 mg by mouth daily.    [provider]  ?levothyroxine (SYNTHROID) 125 MCG tablet Take 125 mcg by mouth every morning. 06/01/21   [provider]  ?methocarbamol (ROBAXIN) 500 MG tablet Take 1 tablet (500 mg total) by mouth 2 (two) times daily as needed for muscle spasms. 12/19/21   Sherrell Puller, PA-C  ?metroNIDAZOLE (FLAGYL) 500 MG tablet Take 1 tablet (500 mg total) by mouth 2 (two) times daily. 12/19/21   Sherrell Puller, PA-C  ?ondansetron  (ZOFRAN-ODT) 4 MG disintegrating tablet Take 1 tablet (4 mg total) by mouth every 8 (eight) hours as needed for nausea or vomiting. 12/11/21   Rayna Sexton, PA-C  ?promethazine (PHENERGAN) 25 MG suppository Place 1 suppository (25 mg total) rectally every 6 (six) hours as needed for nausea or vomiting. 12/19/21   Sherrell Puller, PA-C  ?traMADol (ULTRAM) 50 MG tablet Take 1 tablet (50 mg total) by mouth every 6 (six) hours as needed. 12/21/21   Caren Macadam, MD  ?Vitamin D, Ergocalciferol, (DRISDOL) 1.25 MG (50000 UNIT) CAPS capsule Take by mouth. 01/13/21   [provider]  ?   ? ?Allergies    ?Hydrocodone and Hydrocodone-acetaminophen   ? ?Review of Systems   ?Review of Systems  ?Constitutional:  Negative for fever.  ?HENT:  Negative for ear pain and sore throat.   ?Eyes:  Negative for pain.  ?Respiratory:  Positive for shortness of breath. Negative for cough.   ?Cardiovascular:  Positive for chest pain.  ?Gastrointestinal:  Negative for abdominal pain.  ?Genitourinary:  Negative for flank pain.  ?Musculoskeletal:  Negative for back pain.  ?Skin:  Negative for color change and rash.  ?Neurological:  Negative for syncope.  ?All other systems reviewed and are negative. ? ?Physical Exam ?Updated Vital Signs ?BP 106/64 (BP Location: Right Arm)   Pulse 66   Temp 99.4 ?F (37.4 ?C)   Resp 14   Wt 104.3 kg  SpO2 99%   BMI 37.12 kg/m?  ?Physical Exam ?Constitutional:   ?   Appearance: April Salas is well-developed.  ?HENT:  ?   Head: Normocephalic.  ?   Nose: Nose normal.  ?Eyes:  ?   Extraocular Movements: Extraocular movements intact.  ?Cardiovascular:  ?   Rate and Rhythm: Normal rate.  ?Pulmonary:  ?   Effort: Pulmonary effort is normal. No tachypnea or accessory muscle usage.  ?   Breath sounds: No wheezing.  ?Musculoskeletal:  ?   Right lower leg: No edema.  ?   Left lower leg: No edema.  ?Skin: ?   Coloration: Skin is not jaundiced.  ?Neurological:  ?   Mental Status: April Salas is  alert. Mental status is at baseline.  ? ? ?ED Results / Procedures / Treatments   ?Labs ?(all labs ordered are listed, but only abnormal results are displayed) ?Labs Reviewed  ?CBC WITH DIFFERENTIAL/PLATELET - Abnormal; Notable for the following components:  ?    Result Value  ? WBC 13.0 (*)   ? Neutro Abs 8.3 (*)   ? Abs Immature Granulocytes 0.09 (*)   ? All other components within normal limits  ?BASIC METABOLIC PANEL - Abnormal; Notable for the following components:  ? CO2 19 (*)   ? Glucose, Bld 103 (*)   ? All other components within normal limits  ?BRAIN NATRIURETIC PEPTIDE  ?TROPONIN I (HIGH SENSITIVITY)  ?TROPONIN I (HIGH SENSITIVITY)  ? ? ?EKG ?None ? ?Radiology ?CT Angio Chest PE W and/or Wo Contrast ? ?Result Date: 03/11/2022 ?CLINICAL DATA:  Pulmonary embolism (PE) suspected, unknown D-dimer EXAM: CT ANGIOGRAPHY CHEST WITH CONTRAST TECHNIQUE: Multidetector CT imaging of the chest was performed using the standard protocol during bolus administration of intravenous contrast. Multiplanar CT image reconstructions and MIPs were obtained to evaluate the vascular anatomy. RADIATION DOSE REDUCTION: This exam was performed according to the departmental dose-optimization program which includes automated exposure control, adjustment of the mA and/or kV according to patient size and/or use of iterative reconstruction technique. CONTRAST:  39m OMNIPAQUE IOHEXOL 350 MG/ML SOLN COMPARISON:  Chest x-ray 01/04/2021 FINDINGS: Cardiovascular: Satisfactory opacification of the pulmonary arteries to the segmental level. No evidence of pulmonary embolism. Normal heart size. No pericardial effusion. Thoracic aorta is normal in course and caliber. Mediastinum/Nodes: No enlarged mediastinal, hilar, or axillary lymph nodes. Thyroid gland, trachea, and esophagus demonstrate no significant findings. Lungs/Pleura: Minimal passive atelectasis within the dependent lung fields. No focal airspace consolidation. No pleural effusion or  pneumothorax. Upper Abdomen: No acute abnormality. Musculoskeletal: No chest wall abnormality. No acute or significant osseous findings. Review of the MIP images confirms the above findings. IMPRESSION: No acute pulmonary embolism or other acute intrathoracic findings. Electronically Signed   By: NDavina PokeD.O.   On: 03/11/2022 20:37   ? ?Procedures ?Procedures  ? ? ?Medications Ordered in ED ?Medications  ?albuterol (VENTOLIN HFA) 108 (90 Base) MCG/ACT inhaler 2 puff (has no administration in time range)  ?iohexol (OMNIPAQUE) 350 MG/ML injection 100 mL (77 mLs Intravenous Contrast Given 03/11/22 2016)  ?acetaminophen (TYLENOL) tablet 650 mg (650 mg Oral Given 03/11/22 2026)  ? ? ?ED Course/ Medical Decision Making/ A&P ?  ?                        ?Medical Decision Making ?Amount and/or Complexity of Data Reviewed ?Labs: ordered. ?Radiology: ordered. ? ?Risk ?OTC drugs. ?Prescription drug management. ? ? ?Review of records shows visit yesterday for  Ehlers-Danlos syndrome on outpatient basis. ? ?Cardiac monitoring shows sinus rhythm normal rate. ? ?Comorbidities influencing complexity includes diagnosis of Ehlers-Danlos, syndrome as well as history: COVID symptoms. ? ?Work-up was unremarkable white count mildly elevated 13 chemistry unremarkable troponin undetectable.  proBNP normal at 8 as well. ? ?CT angio shows no evidence of pulmonary embolism and no acute cardio thoracic pathology. ? ?Advised outpatient follow-up with cardiology as needed.  Advised immediate return for worsening symptoms fevers difficulty breathing or any additional concerns. ? ? ? ? ? ? ? ?Final Clinical Impression(s) / ED Diagnoses ?Final diagnoses:  ?Shortness of breath  ? ? ?Rx / DC Orders ?ED Discharge Orders   ? ? None  ? ?  ? ? ?  ?Luna Fuse, MD ?03/11/22 2147 ? ?

## 2022-03-17 ENCOUNTER — Ambulatory Visit (INDEPENDENT_AMBULATORY_CARE_PROVIDER_SITE_OTHER): Payer: BC Managed Care – PPO | Admitting: Cardiology

## 2022-03-17 ENCOUNTER — Encounter: Payer: Self-pay | Admitting: Cardiology

## 2022-03-17 VITALS — BP 112/70 | HR 72 | Ht 66.0 in

## 2022-03-17 DIAGNOSIS — R002 Palpitations: Secondary | ICD-10-CM

## 2022-03-17 DIAGNOSIS — R079 Chest pain, unspecified: Secondary | ICD-10-CM | POA: Diagnosis not present

## 2022-03-17 DIAGNOSIS — R42 Dizziness and giddiness: Secondary | ICD-10-CM

## 2022-03-17 DIAGNOSIS — R0602 Shortness of breath: Secondary | ICD-10-CM

## 2022-03-17 NOTE — Patient Instructions (Signed)
Medication Instructions:  ?Your physician recommends that you continue on your current medications as directed. Please refer to the Current Medication list given to you today. ? ?Testing/Procedures: ?Your physician has requested that you have an echocardiogram. Echocardiography is a painless test that uses sound waves to create images of your heart. It provides your doctor with information about the size and shape of your heart and how well your heart?s chambers and valves are working. This procedure takes approximately one hour. There are no restrictions for this procedure. ?This will be done at our [facility] location:  [address] ? ?Follow-Up: ?At [facility], you and your health needs are our priority.  As part of our continuing mission to provide you with exceptional heart care, we have created designated Provider Care Teams.  These Care Teams include your primary Cardiologist (physician) and Advanced Practice Providers (APPs -  Physician Assistants and Nurse Practitioners) who all work together to provide you with the care you need, when you need it. ? ?We recommend signing up for the patient portal called "MyChart".  Sign up information is provided on this After Visit Summary.  MyChart is used to connect with patients for Virtual Visits (Telemedicine).  Patients are able to view lab/test results, encounter notes, upcoming appointments, etc.  Non-urgent messages can be sent to your provider as well.   ?To learn more about what you can do with MyChart, go to [email or web address].   ? ?Your next appointment:   ?3 month(s) with APP ?6 months with Dr. Gardiner Rhyme ? ? ? ?

## 2022-03-17 NOTE — Progress Notes (Signed)
?Cardiology Office Note:   ? ?Date:  03/17/2022  ? ?ID:  April Salas, DOB 1986-07-12, MRN [id number] ? ?PCP:  [name], NP  ?Cardiologist:  None  ?Electrophysiologist:  None  ? ?Referring MD: [name], NP  ? ?Chief Complaint  ?Patient presents with  ? New Patient (Initial Visit)  ?   ?  ? Shortness of Breath  ? ? ?History of Present Illness:   ? ?April Salas is a 36 y.o. adult with a hx of Ehlers-Danlos syndrome, melanoma, thyroid disease who is referred by [name], MD for shortness of breath.  Seen in the ED on 03/11/2022 reported chest pain and shortness of breath since her COVID infection.  CTPA was done which was unremarkable.  Troponins negative x2. ? ?Since January 2022, has had COVID 4 times.  Since that time has been having issues with dyspnea and palpitations.  States that had improved and last December was lifting weights and doing well.  However had shingles and subsequently worsened.  Reports is having daily palpitations, lasts for seconds and resolved.  Short of breath with minimal exertion.  Reports get lightheaded when stands.  Denies any syncope.  Denies any lower extremity edema.  Reports has been having exertional chest pain.  Reports had chest pain for 3 hours, which prompted to go to the ED on 03/11/2022.  Reports occasional smoking in college but none since.  No known family history of heart disease ? ? ? ? ?Past Medical History:  ?Diagnosis Date  ? Depression   ? Ehlers-Danlos syndrome   ? Melanoma ([place])   ? OCD (obsessive compulsive disorder)   ? Thyroid disease   ? Vitiligo   ? ? ?Past Surgical History:  ?Procedure Laterality Date  ? ABLATION    ? FOOT SURGERY    ? GALLBLADDER SURGERY    ? REVISION OF SCAR ON FACE/HEAD    ? SKIN CANCER EXCISION    ? ? ?Current Medications: ?Current Meds  ?Medication Sig  ? clonazePAM (KLONOPIN) 0.5 MG tablet Take 0.5 mg by mouth 2 (two) times daily as needed.  ? DULoxetine (CYMBALTA) 30 MG capsule Take 60 mg by mouth daily.  ? fluconazole (DIFLUCAN)  150 MG tablet Please take a single 150 mg dose of Diflucan if you begin experiencing a yeast infection.  You have been given a second dose to take 1 week later if your symptoms persist.  ? lamoTRIgine (LAMICTAL) 100 MG tablet Take 100 mg by mouth daily.  ? levothyroxine (SYNTHROID) 125 MCG tablet Take 125 mcg by mouth every morning.  ? Levothyroxine Sodium (TIROSINT) 125 MCG CAPS Take 125 mcg by mouth daily before breakfast.  ? ondansetron (ZOFRAN-ODT) 4 MG disintegrating tablet Take 1 tablet (4 mg total) by mouth every 8 (eight) hours as needed for nausea or vomiting.  ? Vitamin D, Ergocalciferol, (DRISDOL) 1.25 MG (50000 UNIT) CAPS capsule Take by mouth.  ? [DISCONTINUED] doxycycline (VIBRAMYCIN) 100 MG capsule Take 1 capsule (100 mg total) by mouth 2 (two) times daily.  ? [DISCONTINUED] methocarbamol (ROBAXIN) 500 MG tablet Take 1 tablet (500 mg total) by mouth 2 (two) times daily as needed for muscle spasms.  ? [DISCONTINUED] metroNIDAZOLE (FLAGYL) 500 MG tablet Take 1 tablet (500 mg total) by mouth 2 (two) times daily.  ? [DISCONTINUED] promethazine (PHENERGAN) 25 MG suppository Place 1 suppository (25 mg total) rectally every 6 (six) hours as needed for nausea or vomiting.  ? [DISCONTINUED] traMADol (ULTRAM) 50 MG tablet Take 1 tablet (50 mg  total) by mouth every 6 (six) hours as needed.  ?  ? ?Allergies:   Hydrocodone and Hydrocodone-acetaminophen  ? ?Social History  ? ?Socioeconomic History  ? Marital status: Divorced  ?  Spouse name: Not on file  ? Number of children: Not on file  ? Years of education: Not on file  ? Highest education level: Not on file  ?Occupational History  ? Not on file  ?Tobacco Use  ? Smoking status: Never  ? Smokeless tobacco: Never  ?Substance and Sexual Activity  ? Alcohol use: Not Currently  ? Drug use: Yes  ?  Types: Marijuana  ? Sexual activity: Yes  ?  Birth control/protection: None  ?Other Topics Concern  ? Not on file  ?Social History Narrative  ? Not on file  ? ?Social  Determinants of Health  ? ?Financial Resource Strain: Not on file  ?Food Insecurity: Not on file  ?Transportation Needs: Not on file  ?Physical Activity: Not on file  ?Stress: Not on file  ?Social Connections: Not on file  ?  ? ?Family History: ?The patient's family history is not on file. ? ?ROS:   ?Please see the history of present illness.    ? All other systems reviewed and are negative. ? ?EKGs/Labs/Other Studies Reviewed:   ? ?The following studies were reviewed today: ? ? ?EKG:   ?03/11/2022: Normal sinus rhythm, rate 89, low voltage ? ?Recent Labs: ?03/11/2022: B Natriuretic Peptide 8.2; BUN 13; Creatinine, Ser 0.83; Hemoglobin 14.6; Platelets 383; Potassium 3.7; Sodium 137  ?Recent Lipid Panel ?No results found for: CHOL, TRIG, HDL, CHOLHDL, VLDL, LDLCALC, LDLDIRECT ? ?Physical Exam:   ? ?VS:  BP 112/70 (BP Location: Right Arm, Patient Position: Sitting, Cuff Size: Large)   Pulse 72   Ht '5\' 6"'$  (1.676 m)   BMI 37.12 kg/m?    ? ?Wt Readings from Last 3 Encounters:  ?03/11/22 230 lb (104.3 kg)  ?12/19/21 220 lb (99.8 kg)  ?12/11/21 220 lb (99.8 kg)  ?  ? ?GEN:  Well nourished, well developed in no acute distress ?HEENT: Normal ?NECK: No JVD; No carotid bruits ?LYMPHATICS: No lymphadenopathy ?CARDIAC: RRR, no murmurs, rubs, gallops ?RESPIRATORY:  Clear to auscultation without rales, wheezing or rhonchi  ?ABDOMEN: Soft, non-tender, non-distended ?MUSCULOSKELETAL:  No edema; No deformity  ?SKIN: Warm and dry ?NEUROLOGIC:  Alert and oriented x 3 ?PSYCHIATRIC:  Normal affect  ? ?ASSESSMENT:   ? ?1. Shortness of breath   ?2. Palpitations   ?3. Lightheadedness   ?4. Chest pain of uncertain etiology   ? ?PLAN:   ? ?Dyspnea: Has occurred since COVID-19 infection.  Will check echocardiogram to evaluate for myocardial involvement. ? ?Palpitations: Description concerning for arrhythmia.  Currently wearing Zio patch, we will follow-up results ? ?Lightheadedness: Description of symptoms and history of EDS suggest possible  POTS but normal orthostatics in clinic today, no increase in HR with standing.  Discussed conservative measures including staying well-hydrated and use of compression stockings  ? ?Chest pain: Given episode of chest pain lasting 3 hours with negative troponins at recent ED visit, suspect noncardiac chest pain.  Considering age and lack of risk factors, no further cardiac work-up recommended at this time ? ?RTC in 3 months ? ?Medication Adjustments/Labs and Tests Ordered: ?Current medicines are reviewed at length with the patient today.  Concerns regarding medicines are outlined above.  ?Orders Placed This Encounter  ?Procedures  ? ECHOCARDIOGRAM COMPLETE  ? ?No orders of the defined types were placed in this encounter. ? ? ?  Patient Instructions  ?Medication Instructions:  ?Your physician recommends that you continue on your current medications as directed. Please refer to the Current Medication list given to you today. ? ?Testing/Procedures: ?Your physician has requested that you have an echocardiogram. Echocardiography is a painless test that uses sound waves to create images of your heart. It provides your doctor with information about the size and shape of your heart and how well your heart?s chambers and valves are working. This procedure takes approximately one hour. There are no restrictions for this procedure. ?This will be done at our [facility] location:  [address] ? ?Follow-Up: ?At [facility], you and your health needs are our priority.  As part of our continuing mission to provide you with exceptional heart care, we have created designated Provider Care Teams.  These Care Teams include your primary Cardiologist (physician) and Advanced Practice Providers (APPs -  Physician Assistants and Nurse Practitioners) who all work together to provide you with the care you need, when you need it. ? ?We recommend signing up for the patient portal called "MyChart".  Sign up information is  provided on this After Visit Summary.  MyChart is used to connect with patients for Virtual Visits (Telemedicine).  Patients are able to view lab/test results, encounter notes, upcoming appointments, etc.  Non-

## 2022-04-03 ENCOUNTER — Encounter: Payer: Self-pay | Admitting: Cardiology

## 2022-04-03 ENCOUNTER — Ambulatory Visit (HOSPITAL_COMMUNITY): Payer: BC Managed Care – PPO | Attending: Internal Medicine

## 2022-04-03 ENCOUNTER — Ambulatory Visit (INDEPENDENT_AMBULATORY_CARE_PROVIDER_SITE_OTHER): Payer: BC Managed Care – PPO | Admitting: Plastic Surgery

## 2022-04-03 VITALS — BP 133/72 | HR 86 | Ht 66.0 in | Wt 239.2 lb

## 2022-04-03 DIAGNOSIS — R0602 Shortness of breath: Secondary | ICD-10-CM | POA: Insufficient documentation

## 2022-04-03 DIAGNOSIS — N62 Hypertrophy of breast: Secondary | ICD-10-CM | POA: Diagnosis not present

## 2022-04-03 LAB — ECHOCARDIOGRAM COMPLETE
Area-P 1/2: 2.81 cm2
S' Lateral: 2.7 cm

## 2022-04-04 ENCOUNTER — Encounter: Payer: Self-pay | Admitting: Cardiology

## 2022-04-04 ENCOUNTER — Telehealth: Payer: Self-pay | Admitting: Cardiology

## 2022-04-04 NOTE — Telephone Encounter (Signed)
See MyChart message. Already routed to MD to review.  ?

## 2022-04-04 NOTE — Telephone Encounter (Signed)
Patient was calling to speak to the nurse. Please advise  

## 2022-04-09 NOTE — Progress Notes (Signed)
? ?Referring Provider ?[name], NP ?[address] ?[address] ?[place],  [place] [address]  ? ?CC:  ?Breast hypertrophy ? ?April Salas is an 36 y.o. adult.  ?HPI: Patient is a 36 year old who breast hypertrophy.  She is actually a previous patient of mine I performed right face melanoma reconstruction on.  Since then she has not been diagnosed with Ehlers-Danlos syndrome.  Patient does endorse back pain.  She has not had mammograms due to age.  She has breast cancer in her paternal grandmother and aunt.  She is a non-smoker.  [facility]  She is nonbinary. ? ? ?Allergies  ?Allergen Reactions  ? Hydrocodone   ? Hydrocodone-Acetaminophen   ?  Other reaction(s): Blackouts/Vomiting  ? ? ?Outpatient Encounter Medications as of 04/03/2022  ?Medication Sig  ? clonazePAM (KLONOPIN) 0.5 MG tablet Take 0.5 mg by mouth 2 (two) times daily as needed.  ? DULoxetine (CYMBALTA) 30 MG capsule Take 60 mg by mouth daily.  ? lamoTRIgine (LAMICTAL) 100 MG tablet Take 100 mg by mouth daily.  ? Levothyroxine Sodium 125 MCG CAPS Take 125 mcg by mouth daily before breakfast.  ? Vitamin D, Ergocalciferol, (DRISDOL) 1.25 MG (50000 UNIT) CAPS capsule Take by mouth.  ? [DISCONTINUED] fluconazole (DIFLUCAN) 150 MG tablet Please take a single 150 mg dose of Diflucan if you begin experiencing a yeast infection.  You have been given a second dose to take 1 week later if your symptoms persist.  ? [DISCONTINUED] levothyroxine (SYNTHROID) 125 MCG tablet Take 125 mcg by mouth every morning.  ? [DISCONTINUED] ondansetron (ZOFRAN-ODT) 4 MG disintegrating tablet Take 1 tablet (4 mg total) by mouth every 8 (eight) hours as needed for nausea or vomiting.  ? ?No facility-administered encounter medications on file as of 04/03/2022.  ?  ? ?Past Medical History:  ?Diagnosis Date  ? Depression   ? Ehlers-Danlos syndrome   ? Melanoma ([place])   ? OCD (obsessive compulsive disorder)   ? Thyroid disease   ? Vitiligo    ? ? ?Past Surgical History:  ?Procedure Laterality Date  ? ABLATION    ? FOOT SURGERY    ? GALLBLADDER SURGERY    ? REVISION OF SCAR ON FACE/HEAD    ? SKIN CANCER EXCISION    ? ? ?No family history on file. ? ?Social History  ? ?Social History Narrative  ? Not on file  ?  ? ?Review of Systems ?General: Denies fevers, chills, weight loss ?CV: Denies chest pain, shortness of breath, palpitations ? ? ?Physical Exam ? ?  04/03/2022  ?  2:53 PM 03/17/2022  ? 12:59 PM 03/17/2022  ? 12:50 PM  ?Vitals with BMI  ?Height '5\' 6"'$   '5\' 6"'$   ?Weight 239 lbs 3 oz    ?BMI 38.63    ?Systolic 814 481 856  ?Diastolic 72 70 74  ?Pulse 86  72  ?  ?General:  No acute distress,  Alert and oriented, Non-Toxic, Normal speech and affect ?Breast: Sternal notch to nipple 32 on the right and 31 on the left and centimeters.  Base width 19 cm bilaterally.  Nipple to fold 10 cm on the right and 11 cm on the left.  Patient has moderate breast ptosis. ? ?Assessment/Plan ?After discussing the surgery with the patient she is undecided about whether she wants to have female to female breast reduction or a gender affirming procedure because she is nonbinary.  I explained to her that currently we do not have  a multidisciplinary program for gender affirming surgery but we could refer her to [facility] for counseling if she would like.  She is interested in pursuing this. ? ?April Salas ?04/09/2022, 2:04 PM  ? ? ?  ?

## 2022-06-21 ENCOUNTER — Ambulatory Visit: Payer: BC Managed Care – PPO | Admitting: Physician Assistant

## 2022-07-06 ENCOUNTER — Other Ambulatory Visit: Payer: Self-pay

## 2022-07-06 DIAGNOSIS — U099 Post covid-19 condition, unspecified: Secondary | ICD-10-CM

## 2022-07-06 DIAGNOSIS — Q796 Ehlers-Danlos syndrome, unspecified: Secondary | ICD-10-CM

## 2022-07-10 ENCOUNTER — Ambulatory Visit (INDEPENDENT_AMBULATORY_CARE_PROVIDER_SITE_OTHER): Payer: BC Managed Care – PPO | Admitting: Internal Medicine

## 2022-07-10 DIAGNOSIS — U099 Post covid-19 condition, unspecified: Secondary | ICD-10-CM | POA: Diagnosis not present

## 2022-07-10 LAB — PULMONARY FUNCTION TEST
DL/VA % pred: 103 %
DL/VA: 4.62 ml/min/mmHg/L
DLCO cor % pred: 96 %
DLCO cor: 22.48 ml/min/mmHg
DLCO unc % pred: 96 %
DLCO unc: 22.48 ml/min/mmHg
FEF 25-75 Post: 4.87 L/sec
FEF 25-75 Pre: 3.99 L/sec
FEF2575-%Change-Post: 22 %
FEF2575-%Pred-Post: 145 %
FEF2575-%Pred-Pre: 118 %
FEV1-%Change-Post: 8 %
FEV1-%Pred-Post: 104 %
FEV1-%Pred-Pre: 96 %
FEV1-Post: 3.39 L
FEV1-Pre: 3.14 L
FEV1FVC-%Change-Post: 0 %
FEV1FVC-%Pred-Pre: 106 %
FEV6-%Change-Post: 8 %
FEV6-%Pred-Post: 99 %
FEV6-%Pred-Pre: 91 %
FEV6-Post: 3.85 L
FEV6-Pre: 3.55 L
FEV6FVC-%Pred-Post: 101 %
FEV6FVC-%Pred-Pre: 101 %
FVC-%Change-Post: 8 %
FVC-%Pred-Post: 98 %
FVC-%Pred-Pre: 90 %
FVC-Post: 3.85 L
FVC-Pre: 3.55 L
Post FEV1/FVC ratio: 88 %
Post FEV6/FVC ratio: 100 %
Pre FEV1/FVC ratio: 88 %
Pre FEV6/FVC Ratio: 100 %
RV % pred: 85 %
RV: 1.35 L
TLC % pred: 97 %
TLC: 5.16 L

## 2022-07-10 NOTE — Patient Instructions (Signed)
Full PFT Performed Today  

## 2022-07-10 NOTE — Progress Notes (Signed)
Full PFT Performed Today  

## 2022-07-26 ENCOUNTER — Encounter: Payer: Self-pay | Admitting: Internal Medicine

## 2022-07-27 NOTE — Telephone Encounter (Signed)
PFT was done far enough back  that it probably wasn't affected by recent Covid.  Time window is long enough from test. I an ok to see her as planned.She might ask her primary provider about indications for taking an antiviral med like molnupiravir.

## 2022-08-05 NOTE — Progress Notes (Deleted)
08/07/22- 58 yoF for new patient evaluation Medical problem list includes Hypothyroid(Autoimmune Thyroiditis), Ehlers-Danlos/hypermobility, Hx Melanoma, Depression, PTSD, OCD,  Hx Covid infection [id number], Hx Mono,  ED 03/11/22- Shortness of Breath -Cymbalta, Lamictal, clonazepam,  Epworth score- Body weight today-  PFT 07/10/22- WNL  CTachestPE 03/11/22- IMPRESSION: No acute pulmonary embolism or other acute intrathoracic findings.

## 2022-08-07 ENCOUNTER — Ambulatory Visit: Payer: BC Managed Care – PPO | Admitting: Internal Medicine

## 2022-08-08 ENCOUNTER — Encounter: Payer: Self-pay | Admitting: Internal Medicine

## 2022-08-08 ENCOUNTER — Ambulatory Visit (INDEPENDENT_AMBULATORY_CARE_PROVIDER_SITE_OTHER): Payer: BC Managed Care – PPO | Admitting: Internal Medicine

## 2022-08-08 VITALS — BP 130/82 | HR 76 | Ht 66.0 in | Wt 230.0 lb

## 2022-08-08 DIAGNOSIS — R0609 Other forms of dyspnea: Secondary | ICD-10-CM | POA: Diagnosis not present

## 2022-08-08 DIAGNOSIS — R5383 Other fatigue: Secondary | ICD-10-CM | POA: Diagnosis not present

## 2022-08-08 DIAGNOSIS — R0602 Shortness of breath: Secondary | ICD-10-CM

## 2022-08-08 DIAGNOSIS — R0683 Snoring: Secondary | ICD-10-CM

## 2022-08-08 LAB — POCT EXHALED NITRIC OXIDE: FeNO level (ppb): 14

## 2022-08-08 NOTE — Progress Notes (Unsigned)
Poct  

## 2022-08-08 NOTE — Progress Notes (Unsigned)
08/08/22- 37 yoF for new patient evaluation Medical problem list includes Hypothyroid(Autoimmune Thyroiditis), Mitral Regurg, Ehlers-Danlos/hypermobility, Hx Melanoma, Depression, PTSD, OCD,  Hx Covid infection [id number], Hx Mono,  ED 03/11/22- Shortness of Breath -Cymbalta, Lamictal, clonazepam,  Epworth score- Body weight today-230 lbs -----Pt f/u still experiencing SOB, no coughing, some wheezing. PCP prescribed Adair Patter  Not anemic Patient new to me.  She describes beginning awareness of dyspnea after COVID infection and says she has now had "5 COVID infections" and vaccination x4.  Breo from her primary care provider has helped. Not much cough/ wheeze. Albuterol inhaler made her anxious.  Noting dyspnea on exertion with hills and stairs.  Some variability from day-to-day.  Insufficient sleep makes this worse and we discussed overlap symptoms between tiredness, weakness and cardiopulmonary causes for dyspnea.  Says immunology work-up/antibodies normal.  Now going to physical therapy which she thinks helps with her strength and balance. Mother was a chain smoker during patient's childhood representing significant secondhand exposure. Now works as a Statistician at [employer]. Aware that she snores and interested in having a sleep study done after discussion based on exam and history. FENO 08/08/22- 14 WNL ECHO- WNL PFT 07/10/22- WNL CTachestPE 03/11/22- IMPRESSION: No acute pulmonary embolism or other acute intrathoracic findings.  Prior to Admission medications   Medication Sig Start Date End Date Taking? Authorizing Provider  albuterol (VENTOLIN HFA) 108 (90 Base) MCG/ACT inhaler Inhale 2 puffs into the lungs as needed. Every 4-6 hours 04/18/22  Yes [provider]  clonazePAM (KLONOPIN) 0.5 MG tablet Take 0.5 mg by mouth 2 (two) times daily as needed. 06/10/21  Yes [provider]  DULoxetine (CYMBALTA) 30 MG capsule Take 60 mg by mouth daily. 06/13/21  Yes [provider]  fluticasone furoate-vilanterol (BREO ELLIPTA) 200-25 MCG/ACT AEPB Inhale 1 puff into the lungs daily. 06/24/22  Yes [provider]  gabapentin (NEURONTIN) 300 MG capsule Take 300 mg by mouth 2 (two) times daily as needed.   Yes [provider]  lamoTRIgine (LAMICTAL) 100 MG tablet Take 100 mg by mouth daily.   Yes [provider]  Levothyroxine Sodium 125 MCG CAPS Take 125 mcg by mouth daily before breakfast.   Yes [provider]  Vitamin D, Ergocalciferol, (DRISDOL) 1.25 MG (50000 UNIT) CAPS capsule Take by mouth. 01/13/21  Yes [provider]   Past Medical History:  Diagnosis Date   Depression    Ehlers-Danlos syndrome    Melanoma ([place])    OCD (obsessive compulsive disorder)    Thyroid disease    Vitiligo    Past Surgical History:  Procedure Laterality Date   ABLATION     FOOT SURGERY     GALLBLADDER SURGERY     REVISION OF SCAR ON FACE/HEAD     SKIN CANCER EXCISION     No family history on file. Social History   Socioeconomic History   Marital status: Divorced    Spouse name: Not on file   Number of children: Not on file   Years of education: Not on file   Highest education level: Not on file  Occupational History   Not on file  Tobacco Use   Smoking status: Never   Smokeless tobacco: Never  Substance and Sexual Activity   Alcohol use: Not Currently   Drug use: Yes    Types: Marijuana   Sexual activity: Yes    Birth control/protection: None  Other Topics Concern   Not on file  Social History Narrative  Not on file   Social Determinants of Health   Financial Resource Strain: Not on file  Food Insecurity: Not on file  Transportation Needs: Not on file  Physical Activity: Not on file  Stress: Not on file  Social Connections: Not on file  Intimate Partner Violence: Not on file   ROS-see HPI   + = positive Constitutional:    weight loss, night sweats, fevers, chills, +fatigue, lassitude. HEENT:     headaches, difficulty swallowing, tooth/dental problems, sore throat,       sneezing, itching, ear ache, nasal congestion, post nasal drip, snoring CV:    chest pain, orthopnea, PND, swelling in lower extremities, anasarca,                            dizziness, palpitations Resp:   +shortness of breath with exertion or at rest.                productive cough,   non-productive cough, coughing up of blood.              change in color of mucus.  wheezing.   Skin:    rash or lesions. GI:  No-   heartburn, indigestion, abdominal pain, nausea, vomiting, diarrhea,                 change in bowel habits, loss of appetite GU: dysuria, change in color of urine, no urgency or frequency.   flank pain. MS:   joint pain, stiffness, decreased range of motion, back pain. Neuro-     nothing unusual Psych:  change in mood or affect.  depression or anxiety.   memory loss.  OBJ- Physical Exam General- Alert, Oriented, Affect-appropriate, Distress- none acute, +obese Skin- rash-none, lesions- none, excoriation- none Lymphadenopathy- none Head- atraumatic            Eyes- Gross vision intact, PERRLA, conjunctivae and secretions clear            Ears- Hearing, canals-normal            Nose- Clear, no-Septal dev, mucus, polyps, erosion, perforation             Throat- Mallampati II , mucosa clear , drainage- none, tonsils- atrophic, +teeth Neck- flexible , trachea midline, no stridor , thyroid nl, carotid no bruit Chest - symmetrical excursion , unlabored           Heart/CV- RRR , no murmur , no gallop  , no rub, nl s1 s2                           - JVD- none , edema- none, stasis changes- none, varices- none           Lung- clear to P&A, wheeze- none, cough- none , dullness-none, rub- none           Chest wall-  Abd-  Br/ Gen/ Rectal- Not done, not indicated Extrem- +flexible Neuro- grossly intact to observation

## 2022-08-08 NOTE — Patient Instructions (Signed)
Order-  FENO    dx Asthma mild intermittent  Script sent to try Xopenex hfa inhaler instead of albuterol   inhale 2 puffs up to every 6 hours if needed  Order- Schedule home sleep test   dx snoring, fatigue  Please call us about 2 weeks after your sleep test for results and recommendations

## 2022-08-09 ENCOUNTER — Encounter: Payer: Self-pay | Admitting: Internal Medicine

## 2022-08-09 DIAGNOSIS — R0609 Other forms of dyspnea: Secondary | ICD-10-CM | POA: Insufficient documentation

## 2022-08-09 DIAGNOSIS — R0683 Snoring: Secondary | ICD-10-CM | POA: Insufficient documentation

## 2022-08-09 NOTE — Assessment & Plan Note (Signed)
Obesity and snoring suggest OSA with daytime tiredness possibly contributing to her sense of poor exercise tolerance. Appropriate discussion done and she chooses to go forward with sleep study. Plan-schedule sleep study

## 2022-08-09 NOTE — Assessment & Plan Note (Signed)
Overweight and deconditioning contribute.  She reports subjective improvement with Breo inhaler.  Albuterol inhaler makes her anxious so we will try Xopenex.  Consider methacholine challenge.  Consider cardiopulmonary exercise test. Plan-continue Breo.  Prescription changing albuterol rescue inhaler to Xopenex [facility]

## 2022-09-18 IMAGING — CT CT ABD-PELV W/ CM
2 of 4 series · 17 of 46 positions shown, 19 images · IV contrast (APPLIED)
Comparison: None.

CLINICAL DATA: Left-sided flank pain.

EXAM:
CT ABDOMEN AND PELVIS WITH CONTRAST
TECHNIQUE: Multidetector CT imaging of the abdomen and pelvis was performed
using the standard protocol following bolus administration of
intravenous contrast.
CONTRAST:  100mL OMNIPAQUE IOHEXOL 300 MG/ML  SOLN

[Series 3: abd/ pelvis 5.0 i30f 2 · axial · 0.98mm/px · z∈[+654,+1099]mm · 14 of 99 slices shown, 16 images]
[im 5/99  soft-tissue]
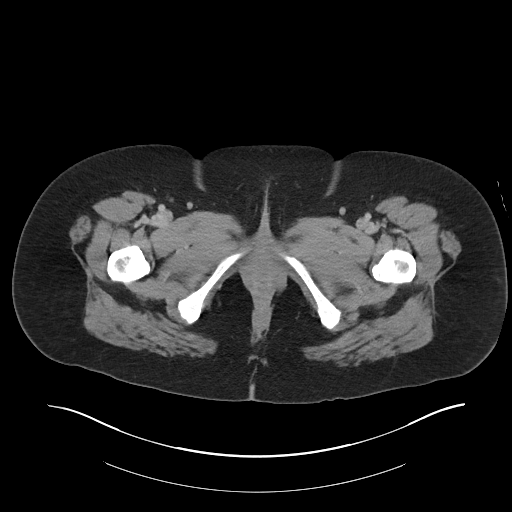
[im 5/99  bone]
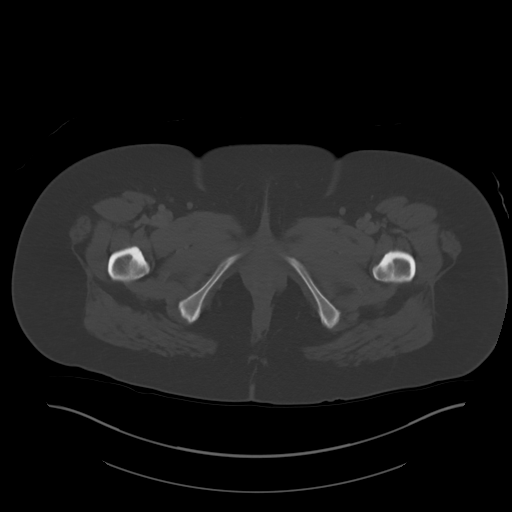
[im 13/99  soft-tissue]
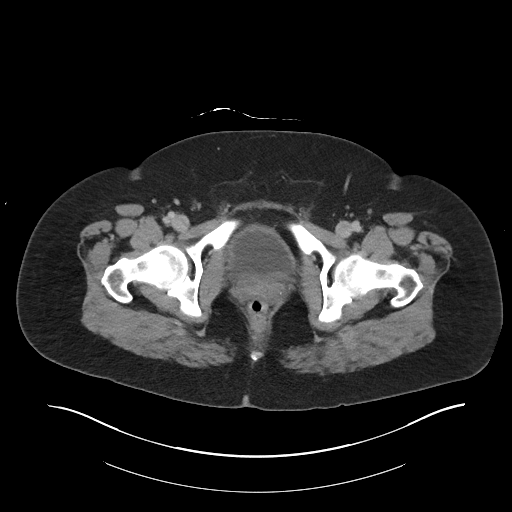
[im 21/99  soft-tissue]
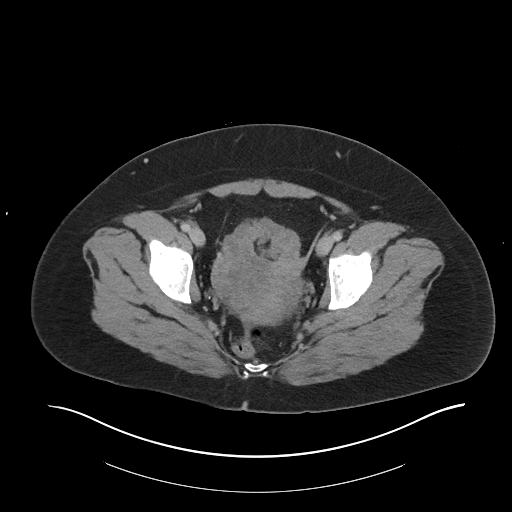
[im 25/99  soft-tissue]
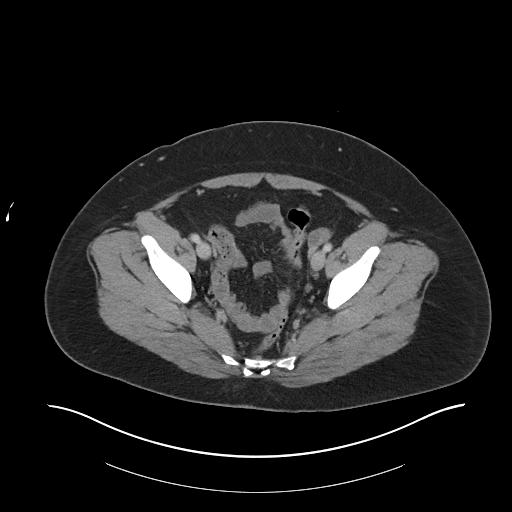
[im 33/99  soft-tissue]
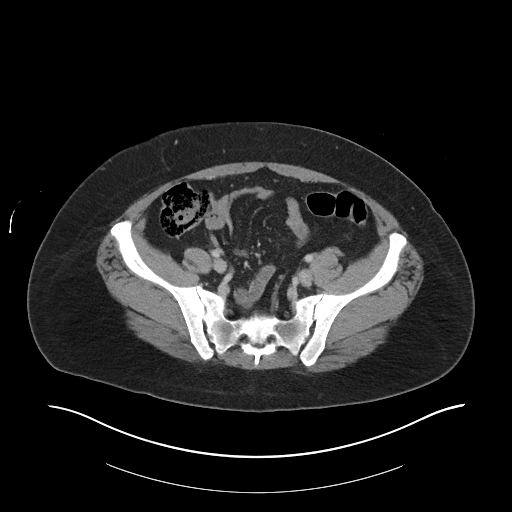
[im 41/99  soft-tissue]
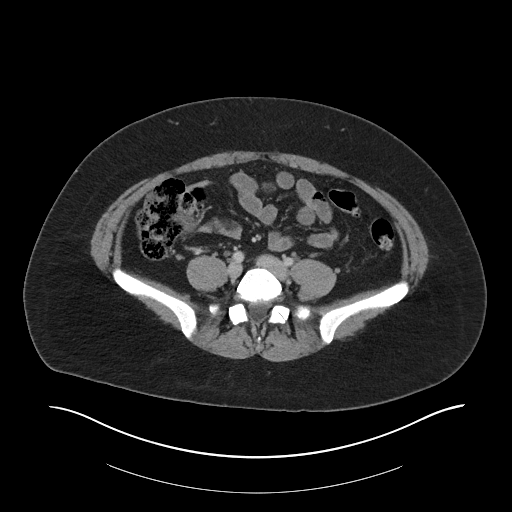
[im 45/99  soft-tissue]
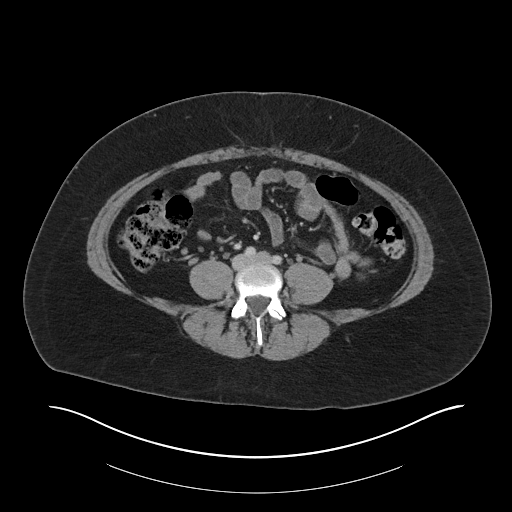
[im 54/99  soft-tissue]
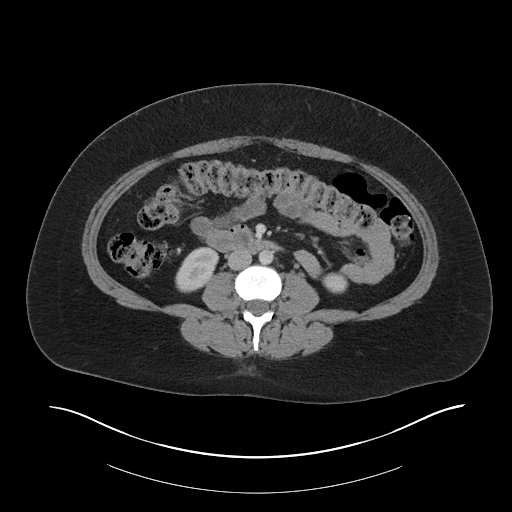
[im 58/99  soft-tissue]
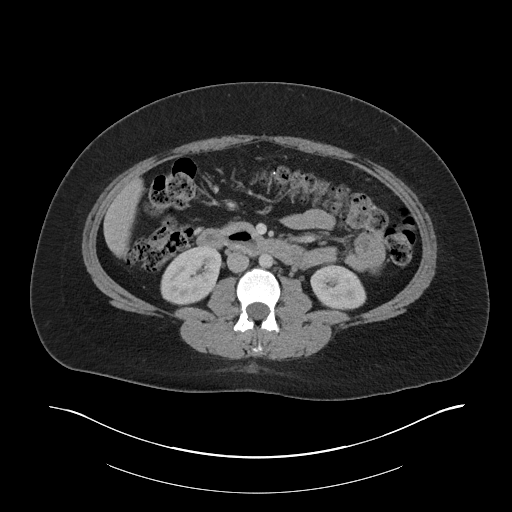
[im 58/99  bone]
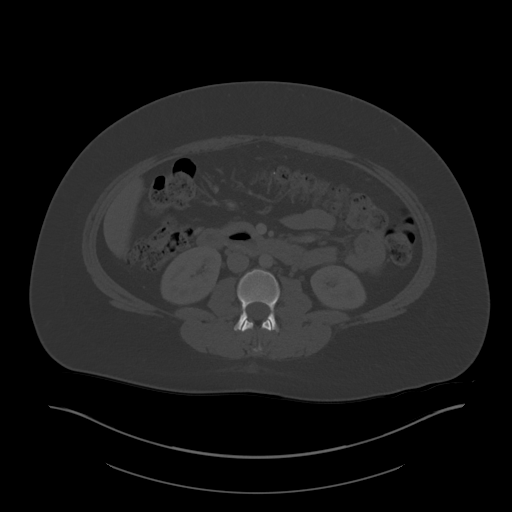
[im 66/99  soft-tissue]
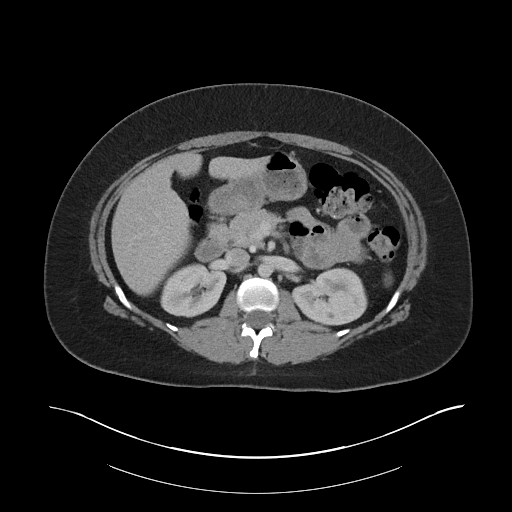
[im 74/99  soft-tissue]
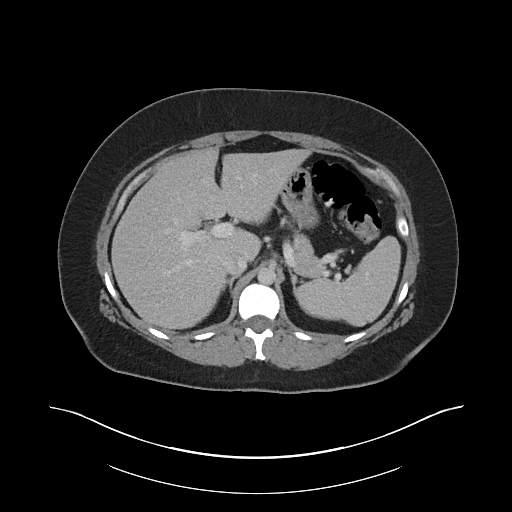
[im 78/99  soft-tissue]
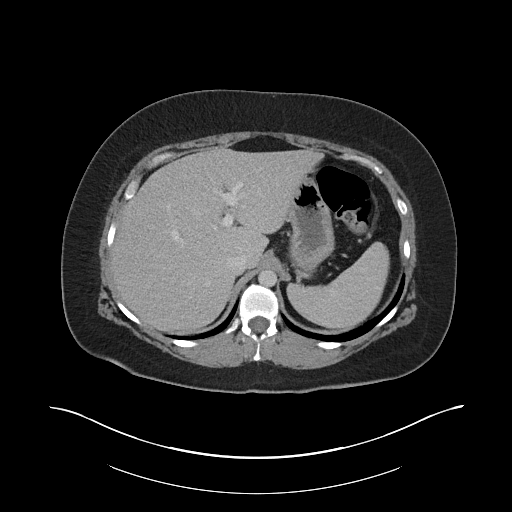
[im 86/99  soft-tissue]
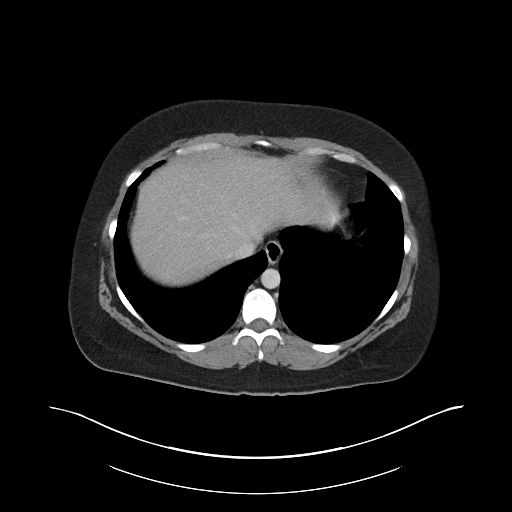
[im 94/99  soft-tissue]
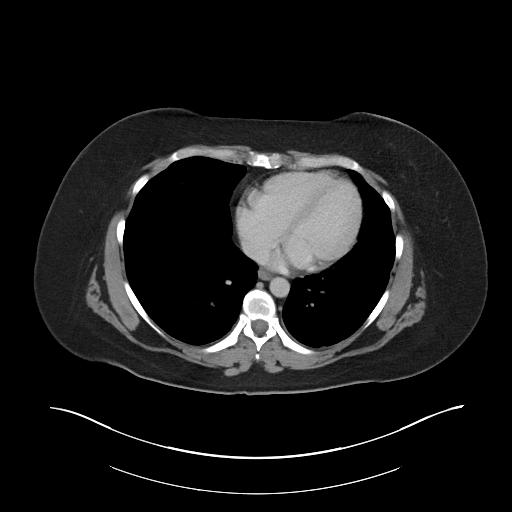

[Series 6: coronal soft tissue · coronal · 0.98mm/px · 3 of 105 slices shown]
[im 35/105  soft-tissue]
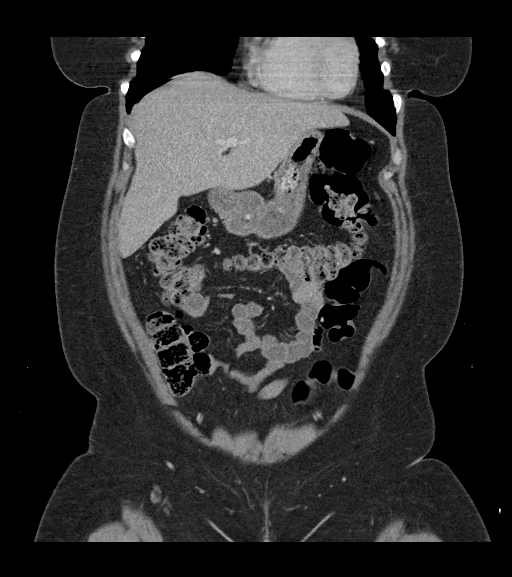
[im 47/105  soft-tissue]
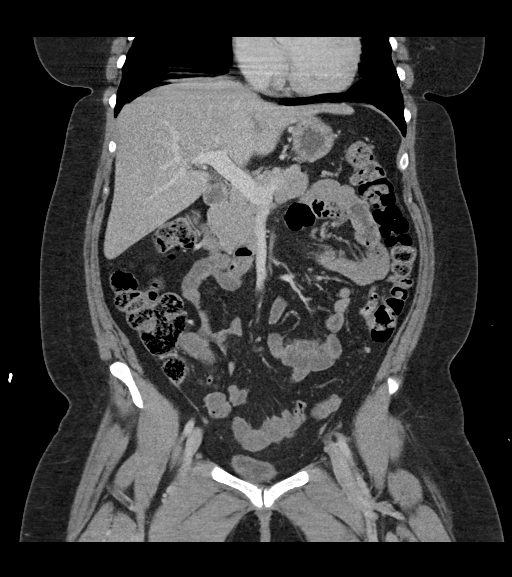
[im 58/105  soft-tissue]
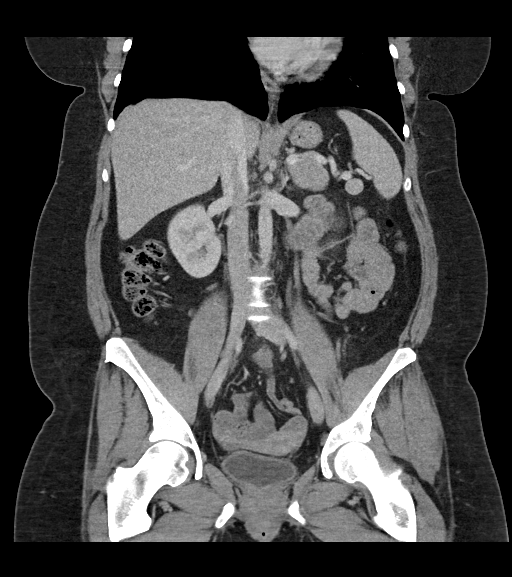

[17 of 46 positions shown; findings below may reference images not displayed]

FINDINGS: Lower chest: No acute abnormality.

Hepatobiliary: No focal liver abnormality is seen. Status post
cholecystectomy. No biliary dilatation.

Pancreas: Unremarkable. No pancreatic ductal dilatation or
surrounding inflammatory changes.

Spleen: Normal in size without focal abnormality.

Adrenals/Urinary Tract: Adrenal glands are unremarkable. Kidneys are
normal, without renal calculi, focal lesion, or hydronephrosis.
Bladder is unremarkable.

Stomach/Bowel: Stomach is within normal limits. Appendix appears
normal. No evidence of bowel wall thickening, distention, or
inflammatory changes.

Vascular/Lymphatic: No significant vascular findings are present. No
enlarged abdominal or pelvic lymph nodes.

Reproductive: There is a 2.4 cm right adnexal cyst, likely related
to the right ovary. Left ovary and uterus are grossly within normal
limits.

Other: No abdominal wall hernia or abnormality. No abdominopelvic
ascites.

Musculoskeletal: No acute or significant osseous findings.
IMPRESSION: 1. 2.4 cm right ovarian simple-appearing cyst. No follow-up imaging
is recommended.
Reference: JACR [DATE]):[id number]

## 2022-09-26 IMAGING — US US PELVIS COMPLETE TRANSABD/TRANSVAG W DUPLEX AND/OR DOPPLER
1 series · 14 of 25 positions shown · non-contrast
Comparison: None

CLINICAL DATA: Vaginal discharge with left flank pain. No menses
since endometrial ablation. Patient also has history of
pyelonephritis.



[Series 1: us pelvic complete w transvaginal and torsion righ · 59 acquisitions, 14 frames shown]
[im 1/59]
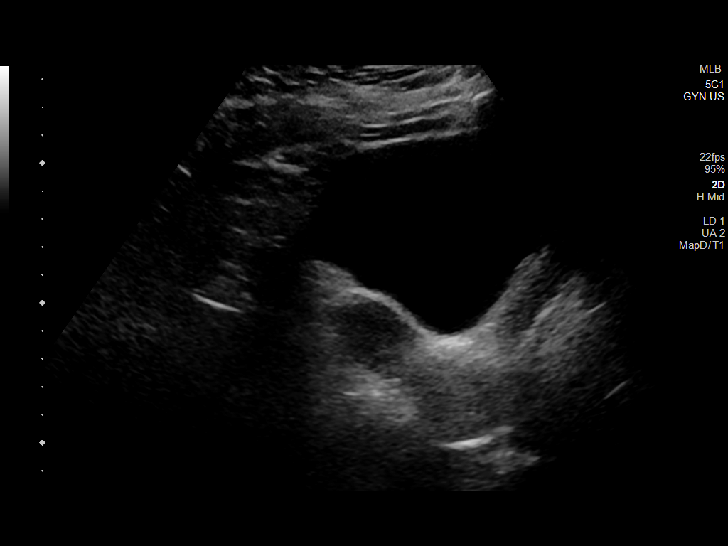
[im 5/59]
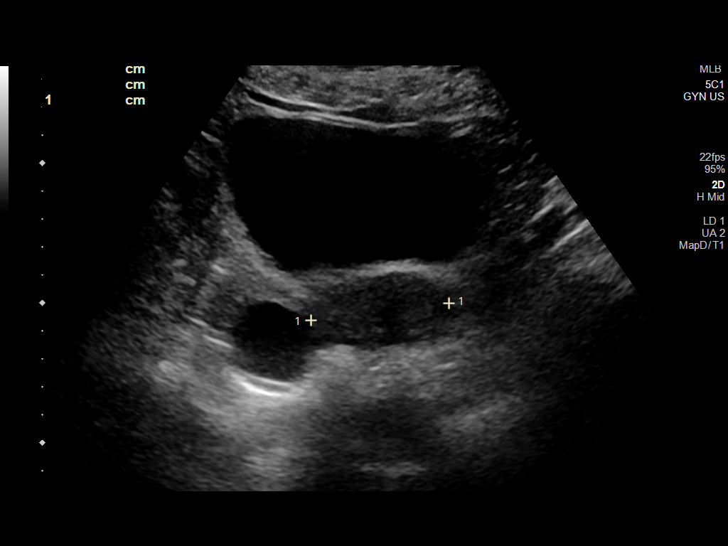
[im 10/59]
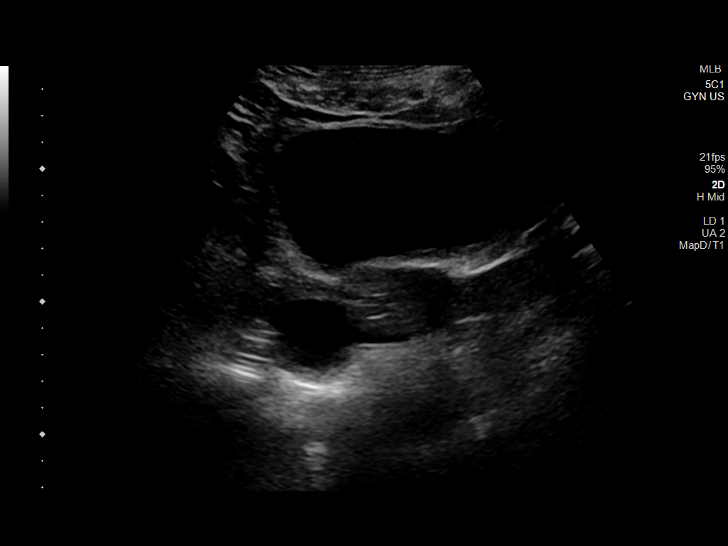
[im 15/59]
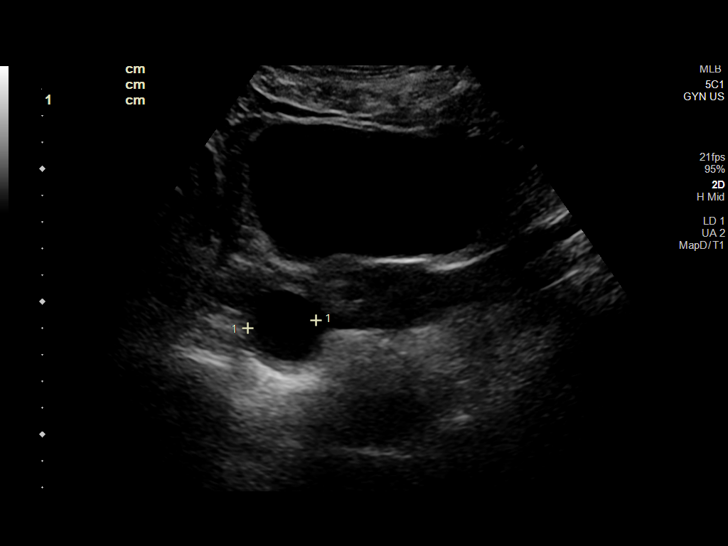
[im 20/59]
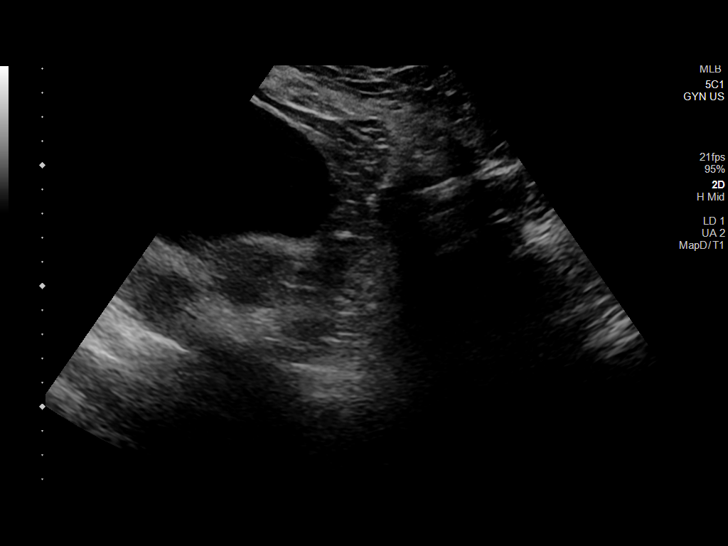
[im 22/59]
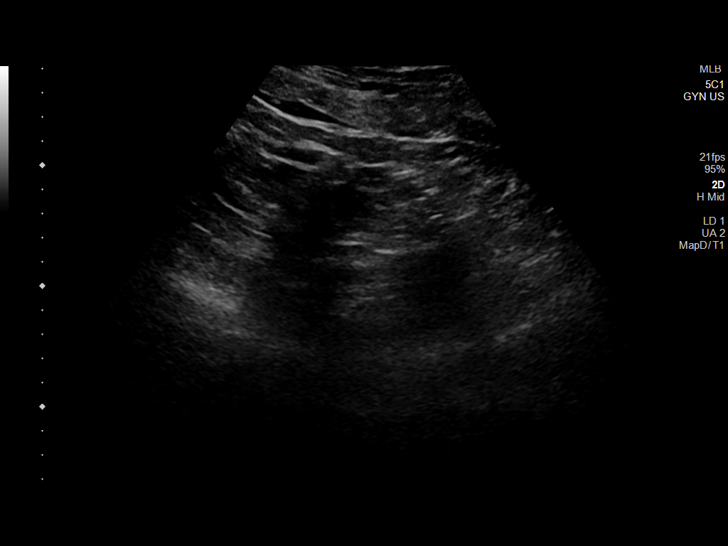
[im 27/59]
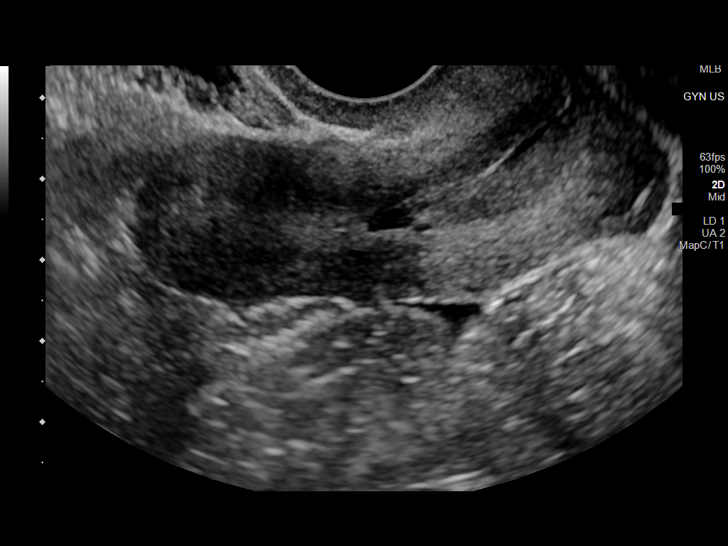
[im 32/59]
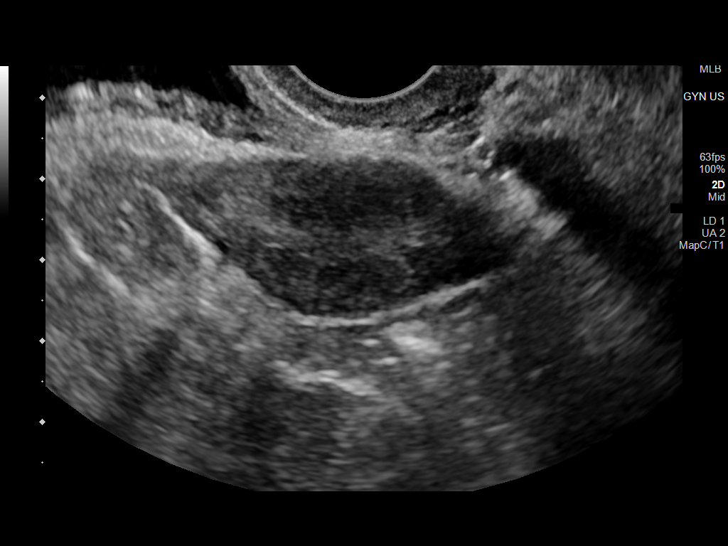
[im 37/59]
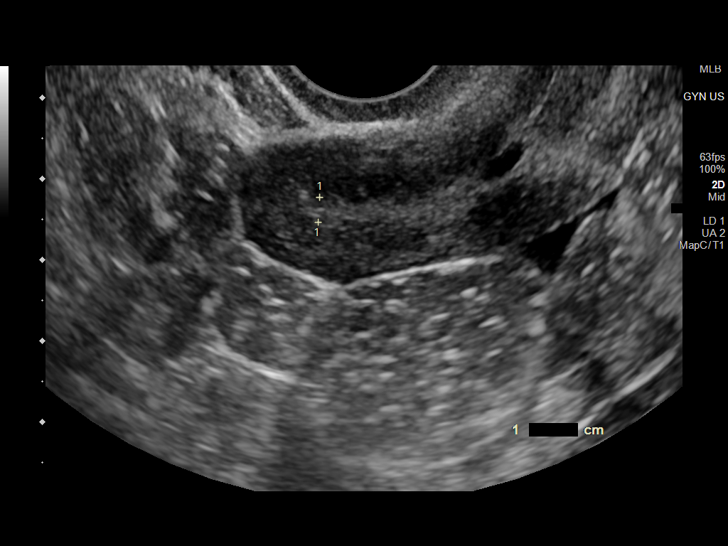
[im 39/59]
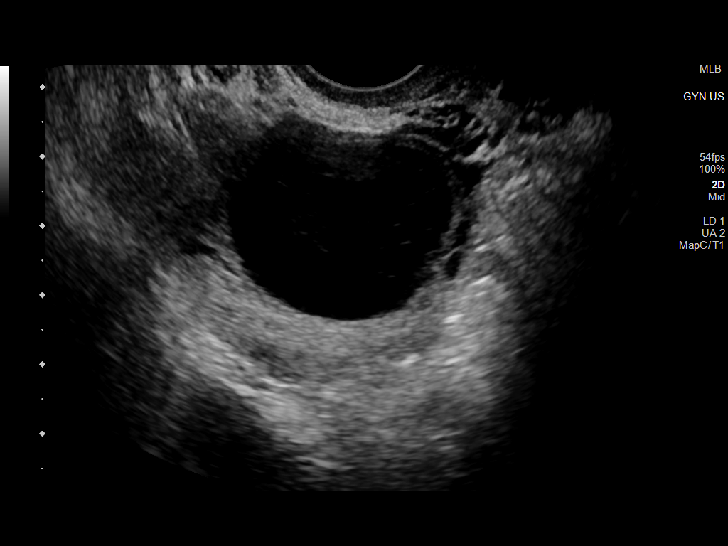
[im 44/59]
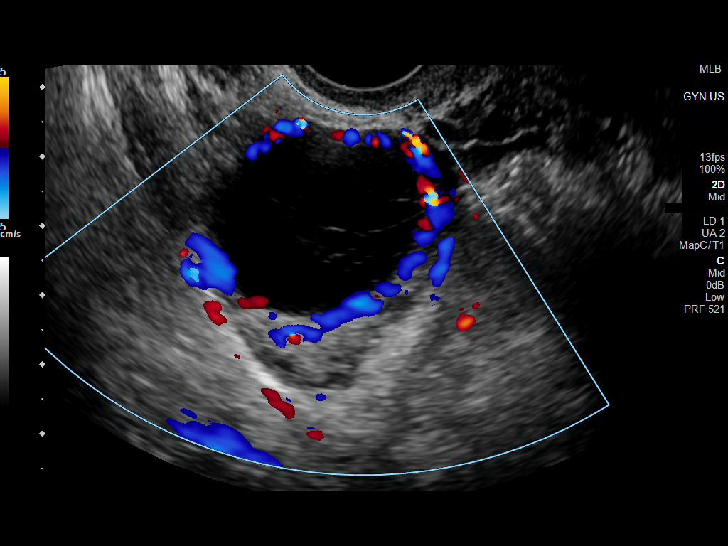
[im 49/59]
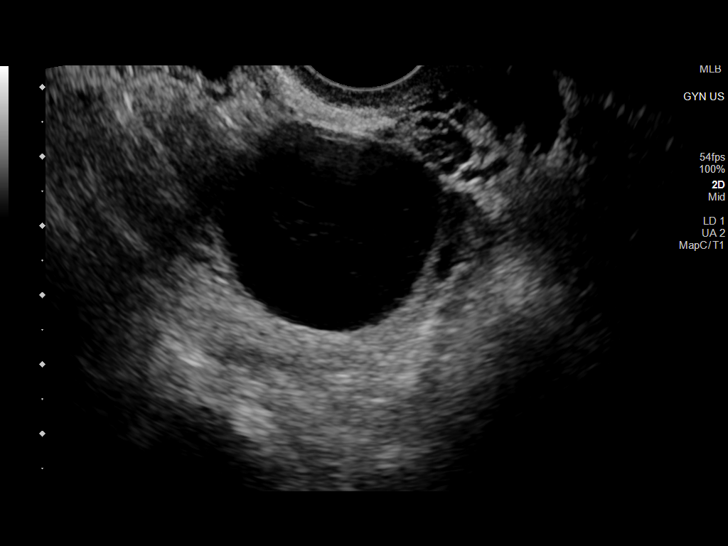
[im 54/59]
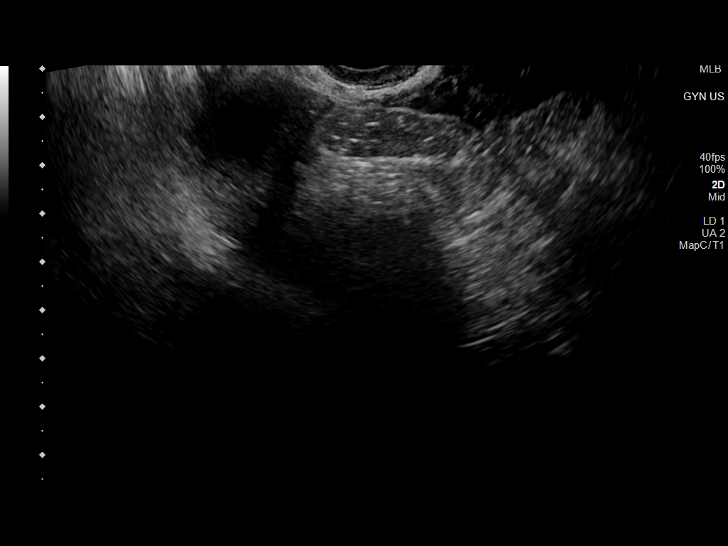
[im 59/59]
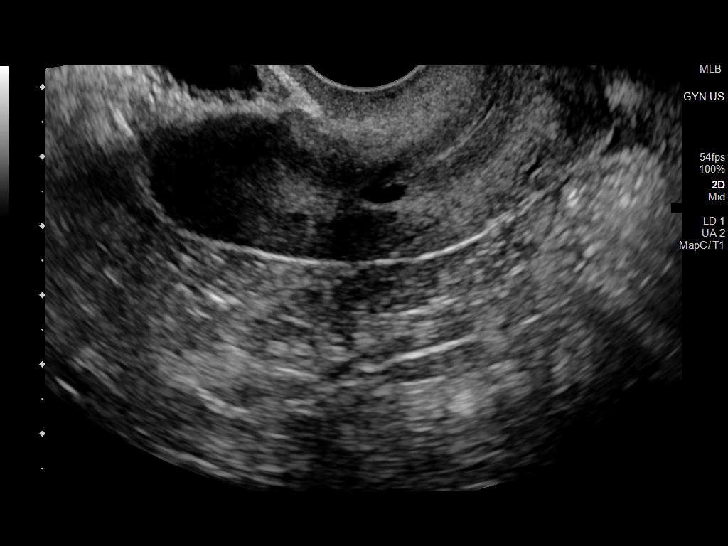

[14 of 25 positions shown; findings below may reference images not displayed]

FINDINGS: Uterus

Measurements: 6.4 x 2.0 x 3.2 = volume: 21.8 mL. No fibroids or
other mass visualized.

Endometrium

Thickness: 3 mm.  No focal abnormality visualized.

Right ovary

Measurements: 4.0 x 3.7 x 3.8 = volume: 29.4 mL. Anechoic avascular
cyst with thin linear echoes measuring 3.0 x 2.8 x 3.0 cm, likely
representing a complex cyst or hemorrhagic cyst.

Left ovary

Not visualized.

Other findings

No abnormal free fluid.
IMPRESSION: Normal uterus with normal endometrium.

Left ovary not visualized.  Complex cyst in the right ovary.

## 2022-11-27 NOTE — Progress Notes (Unsigned)
    Patient Active Problem List   Diagnosis Date Noted  . Dyspnea on exertion 08/09/2022  . Snoring 08/09/2022  . 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 Hypothyroidism 10/14/2021  . Melanoma in situ of face excluding eyelid, nose, lip, and ear (HCC) 10/14/2021  . Posttraumatic stress disorder 10/14/2021  . Vitamin D deficiency 10/14/2021  . Mononucleosis 07/29/2021  . EDS (Ehlers-Danlos syndrome) 01/21/2020    Patient's Medications  New Prescriptions   No medications on file  Previous Medications   ALBUTEROL (VENTOLIN HFA) 108 (90 BASE) MCG/ACT INHALER    Inhale 2 puffs into the lungs as needed. Every 4-6 hours   CLONAZEPAM (KLONOPIN) 0.5 MG TABLET    Take 0.5 mg by mouth 2 (two) times daily as needed.   DULOXETINE (CYMBALTA) 30 MG CAPSULE    Take 60 mg by mouth daily.   FLUTICASONE FUROATE-VILANTEROL (BREO ELLIPTA) 200-25 MCG/ACT AEPB    Inhale 1 puff into the lungs daily.   GABAPENTIN (NEURONTIN) 300 MG CAPSULE    Take 300 mg by mouth 2 (two) times daily as needed.   LAMOTRIGINE (LAMICTAL) 100 MG TABLET    Take 100 mg by mouth daily.   LEVOTHYROXINE SODIUM 125 MCG CAPS    Take 125 mcg by mouth daily before breakfast.   VITAMIN D, ERGOCALCIFEROL, (DRISDOL) 1.25 MG (50000 UNIT) CAPS CAPSULE    Take by mouth.  Modified Medications   No medications on file  Discontinued Medications   No medications on file    Subjective:  Reports she has 7 covid 19 infection in 2022( 3 times)  and 2023 ( 4 times). However tells me all her covid tests were done at home and [facility]. None of the SARSCOV2 tests done in EMR including care everywhere surprisingly are negative. She tells me those tests were done when she developed full blown symptoms. Reports she had exposure to her family members who had COVID 19 leading to infection. Describes her symptoms start with sorethroat, headache, low grade fevers followed by respiratory symptoms. She had mostly GI symptoms in the last few covid episodes.   She was also referred to [facility] covid clinic in April 2023 and her symptoms started getting better with inhalers as well as PT.  Several ED visits in 2022, diagnosed to have mononucleosis in 2022 " Internal shingles in Jan 2023 where she had no rashes biut left lower abdominal pain which improved with gabapentin   Seen with allergist/immunologist at [facility] in April 2023 for joint pain who diagnosed her with EDS. She was told to have no primary immunodeficiency disease.   She had COVID in June as well as August 2023, got her 5th covid shot in sept 27, 2023 and got another covid in October 2023. All of these tests were covid ag tests at home.   She follows Endocrinology for Hashimoto's thyroiditis and on levothyroxine.  She also has h/o melanoma that was excised in the past.  Reports complex PTSD as well as OCD and is on Duloxetine, Lamictal and clonazepam as needed.   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 She is sexually active with female partner. Denies smoking and alcohol and uses THC She reports most of her symptoms are improving/better except occasional headache and fatigue.   Review of Systems: Denies fevers, chills and sweats Denies nausea, vomiting, abdominal pain and diarrhea Denies cough, chest pain and SOB Denies GU symptoms or abnormal BM She has non specific joint pain due to EDS Denies   rashes  Past Medical History:  Diagnosis Date  . Depression   . Ehlers-Danlos syndrome   . Melanoma (HCC)   . OCD (obsessive compulsive disorder)   . Thyroid disease   . Vitiligo    Past Surgical History:  Procedure Laterality Date  . ABLATION    . FOOT SURGERY    . GALLBLADDER SURGERY    . REVISION OF SCAR ON FACE/HEAD    . SKIN CANCER EXCISION       Social History   Tobacco Use  . Smoking status: Never  . Smokeless tobacco: Never  Substance Use Topics  . Alcohol use: Not Currently  . Drug use: Yes    Types: Marijuana    No family history on  file.  Allergies  Allergen Reactions  . Hydrocodone   . Hydrocodone-Acetaminophen     Other reaction(s): Blackouts/Vomiting    Health Maintenance  Topic Date Due  . HIV Screening  Never done  . Hepatitis C Screening  Never done  . PAP SMEAR-Modifier  Never done  . INFLUENZA VACCINE  07/11/2022  . COVID-19 Vaccine (5 - 2023-24 season) 08/11/2022  . DTaP/Tdap/Td (2 - Td or Tdap) 05/11/2030  . HPV VACCINES  Aged Out    Objective: BP 118/81   Pulse 80   Temp 98.4 F (36.9 C)   SpO2 97%   Physical Exam Constitutional:      Appearance: Normal appearance. Obese  HENT:     Head: Normocephalic and atraumatic.      Mouth: Mucous membranes are moist. No enlarged tonsils Eyes:    Conjunctiva/sclera: Conjunctivae normal.     Pupils: Pupils are equal, round, and bilaterally symmetrical   Cardiovascular:     Rate and Rhythm: Normal rate and regular rhythm.     Heart sounds:   Pulmonary:     Effort: Pulmonary effort is normal.     Breath sounds: Normal breath sounds.   Abdominal:     General: Non distended     Palpations: soft.   Musculoskeletal:        General: Normal range of motion.   Skin:    General: Skin is warm and dry.     Comments: No obvious rashes   Neurological:     General: grossly non focal     Mental Status: awake, alert and oriented to person, place, and time.   Psychiatric:        Mood and Affect: Mood normal.   Lab Results Lab Results  Component Value Date   WBC 13.0 (H) 03/11/2022   HGB 14.6 03/11/2022   HCT 41.6 03/11/2022   MCV 86.3 03/11/2022   PLT 383 03/11/2022    Lab Results  Component Value Date   CREATININE 0.83 03/11/2022   BUN 13 03/11/2022   NA 137 03/11/2022   K 3.7 03/11/2022   CL 105 03/11/2022   CO2 19 (L) 03/11/2022   No results found for: "ALT", "AST", "GGT", "ALKPHOS", "BILITOT"  No results found for: "CHOL", "HDL", "LDLCALC", "LDLDIRECT", "TRIG", "CHOLHDL" No results found for: "LABRPR", "RPRTITER" No results  found for: "HIV1RNAQUANT", "HIV1RNAVL", "CD4TABS"  COVID test per EMR  07/01/2019 SARScov2 RNA, RT PCR negative  10/15/2019 SARScov2 NAA negative  10/29/2019 SARScov2 RT PCR negative  06/09/2020 SARScov2 NAA negative  06/09/2020 SARScov2 NAA negative 06/22/2020 SARScov2 ag negative  06/24/2020 SARScov2 NAAT, RT PCR negative  08/17/2020 SARScov2 NAAT, RT PCR negative  09/29/20 SARScov2 NAAT, RT PCR negative  06/14/2021 SARS cov2 NAAT/RT PCR negative  03/27/22   EBV ab/VCA IgG positive  HIV negative   10/16/2022  CCP abs  <0.5 ANA Negative  Centromere antibodies <0.2 Chromatin antibodies <0.2 Ds DNA antibodies <1 Jo 1antibodies <0.2 RNP antibodies <0.2 SCL 70 antibodies <0.2 Smith antibodies IgG <0.2 SS-A antibodies IgG <0.2 SS-B antibodies IgG <0.2 CRP 1.42, ESR 21  CBC and CMP no significant abnormality   I have personally spent more than 70 minutes involved in face-to-face and non-face-to-face activities for this patient on the day of the visit. Professional time spent includes the following activities: Preparing to see the patient (review of tests), Obtaining and/or reviewing separately obtained history (admission/discharge record), Performing a medically appropriate examination and/or evaluation , Ordering medications/tests/procedures, referring and communicating with other health care professionals, Documenting clinical information in the EMR, Independently interpreting results (not separately reported), Communicating results to the patient/family/caregiver, Counseling and educating the patient/family/caregiver and Care coordination (not separately reported).   Wilber Oliphant, [facility] 11/27/2022, 2:54 PM

## 2022-11-28 ENCOUNTER — Other Ambulatory Visit: Payer: Self-pay

## 2022-11-28 ENCOUNTER — Ambulatory Visit (INDEPENDENT_AMBULATORY_CARE_PROVIDER_SITE_OTHER): Payer: BC Managed Care – PPO | Admitting: Infectious Diseases

## 2022-11-28 ENCOUNTER — Encounter: Payer: Self-pay | Admitting: Infectious Diseases

## 2022-11-28 VITALS — BP 118/81 | HR 80 | Temp 98.4°F

## 2022-11-28 DIAGNOSIS — U099 Post covid-19 condition, unspecified: Secondary | ICD-10-CM | POA: Diagnosis not present

## 2022-11-28 DIAGNOSIS — B999 Unspecified infectious disease: Secondary | ICD-10-CM | POA: Diagnosis not present

## 2022-11-28 DIAGNOSIS — B2700 Gammaherpesviral mononucleosis without complication: Secondary | ICD-10-CM

## 2022-11-30 ENCOUNTER — Telehealth: Payer: Self-pay

## 2022-12-02 ENCOUNTER — Encounter: Payer: Self-pay | Admitting: Infectious Diseases

## 2022-12-02 LAB — IGG, IGA, IGM
IgG (Immunoglobin G), Serum: 778 mg/dL (ref 600–1640)
IgM, Serum: 48 mg/dL — ABNORMAL LOW (ref 50–300)
Immunoglobulin A: 140 mg/dL (ref 47–310)

## 2022-12-02 LAB — IGE: IgE (Immunoglobulin E), Serum: 3 kU/L (ref <=114)

## 2022-12-02 LAB — SARS COV-2 SEROLOGY(COVID-19)AB(IGG,IGM),IMMUNOASSAY
SARS CoV-2 AB IgG: NEGATIVE
SARS CoV-2 IgM: NEGATIVE

## 2022-12-04 NOTE — Progress Notes (Deleted)
08/08/22- 38 yoF for new patient evaluation Medical problem list includes Hypothyroid(Autoimmune Thyroiditis), Mitral Regurg, Ehlers-Danlos/hypermobility, Hx Melanoma, Depression, PTSD, OCD,  Hx Covid infection [id number], Hx Mono,  ED 03/11/22- Shortness of Breath -Cymbalta, Lamictal, clonazepam,  Epworth score- Body weight today-230 lbs -----Pt f/u still experiencing SOB, no coughing, some wheezing. PCP prescribed Adair Patter  Not anemic Patient new to me.  She describes beginning awareness of dyspnea after COVID infection and says she has now had "5 COVID infections" and vaccination x4.  Breo from her primary care provider has helped. Not much cough/ wheeze. Albuterol inhaler made her anxious.  Noting dyspnea on exertion with hills and stairs.  Some variability from day-to-day.  Insufficient sleep makes this worse and we discussed overlap symptoms between tiredness, weakness and cardiopulmonary causes for dyspnea.  Says immunology work-up/antibodies normal.  Now going to physical therapy which she thinks helps with her strength and balance. Mother was a chain smoker during patient's childhood representing significant secondhand exposure. Now works as a Statistician at [employer]. Aware that she snores and interested in having a sleep study done after discussion based on exam and history. FENO 08/08/22- 14 WNL ECHO- WNL PFT 07/10/22- WNL CTachestPE 03/11/22- IMPRESSION: No acute pulmonary embolism or other acute intrathoracic findings.  12/05/22- 36 yoF never smoker followed for dyspnea, complicated by  Hypothyroid(Autoimmune Thyroiditis), Mitral Regurg, Ehlers-Danlos/hypermobility, Hx Melanoma, Depression, PTSD, OCD,  Hx Covid infection [id number], Hx Mono,  ED 03/11/22- Shortness of Breath -Cymbalta, Lamictal, clonazepam,  HST 08/08/22- ?not down   ROS-see HPI   + = positive Constitutional:    weight loss, night sweats, fevers, chills, +fatigue, lassitude. HEENT:    headaches, difficulty  swallowing, tooth/dental problems, sore throat,       sneezing, itching, ear ache, nasal congestion, post nasal drip, snoring CV:    chest pain, orthopnea, PND, swelling in lower extremities, anasarca,                            dizziness, palpitations Resp:   +shortness of breath with exertion or at rest.                productive cough,   non-productive cough, coughing up of blood.              change in color of mucus.  wheezing.   Skin:    rash or lesions. GI:  No-   heartburn, indigestion, abdominal pain, nausea, vomiting, diarrhea,                 change in bowel habits, loss of appetite GU: dysuria, change in color of urine, no urgency or frequency.   flank pain. MS:   joint pain, stiffness, decreased range of motion, back pain. Neuro-     nothing unusual Psych:  change in mood or affect.  depression or anxiety.   memory loss.  OBJ- Physical Exam General- Alert, Oriented, Affect-appropriate, Distress- none acute, +obese Skin- rash-none, lesions- none, excoriation- none Lymphadenopathy- none Head- atraumatic            Eyes- Gross vision intact, PERRLA, conjunctivae and secretions clear            Ears- Hearing, canals-normal            Nose- Clear, no-Septal dev, mucus, polyps, erosion, perforation             Throat- Mallampati II , mucosa clear , drainage- none, tonsils- atrophic, +teeth  Neck- flexible , trachea midline, no stridor , thyroid nl, carotid no bruit Chest - symmetrical excursion , unlabored           Heart/CV- RRR , no murmur , no gallop  , no rub, nl s1 s2                           - JVD- none , edema- none, stasis changes- none, varices- none           Lung- clear to P&A, wheeze- none, cough- none , dullness-none, rub- none           Chest wall-  Abd-  Br/ Gen/ Rectal- Not done, not indicated Extrem- +flexible Neuro- grossly intact to observation

## 2022-12-05 ENCOUNTER — Ambulatory Visit: Payer: BC Managed Care – PPO | Admitting: Internal Medicine

## 2022-12-17 IMAGING — CT CT ANGIO CHEST
2 of 7 series · 16 of 46 positions shown · IV contrast (agent unspecified)
Comparison: Chest x-ray 01/04/2021

CLINICAL DATA: Pulmonary embolism (PE) suspected, unknown D-dimer

EXAM:
CT ANGIOGRAPHY CHEST WITH CONTRAST
TECHNIQUE: Multidetector CT imaging of the chest was performed using the
standard protocol during bolus administration of intravenous
contrast. Multiplanar CT image reconstructions and MIPs were
obtained to evaluate the vascular anatomy.

[Series 5: pe axial thins · axial · 0.73mm/px · z∈[+786,+1002]mm · 13 of 250 slices shown]
[im 17/250  lung]
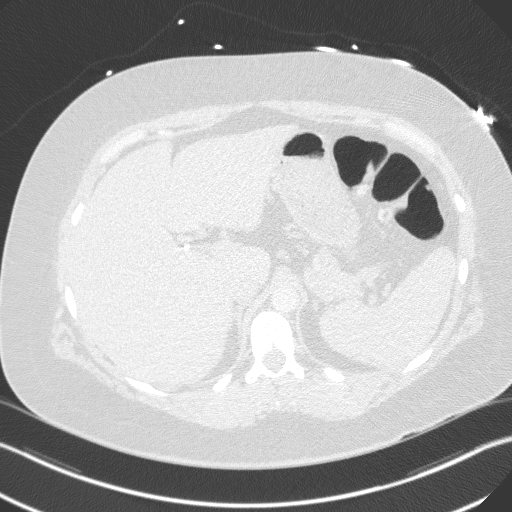
[im 34/250  soft-tissue]
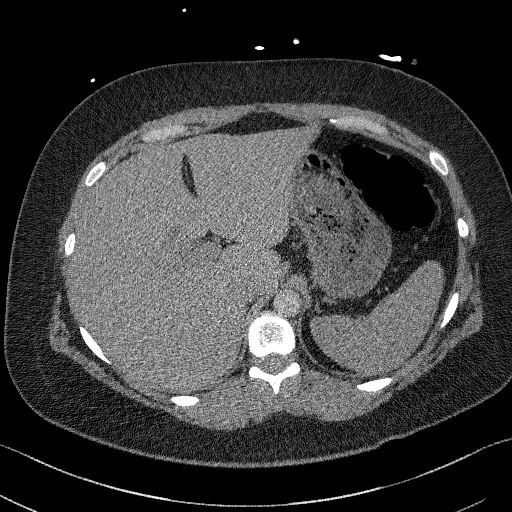
[im 50/250  lung]
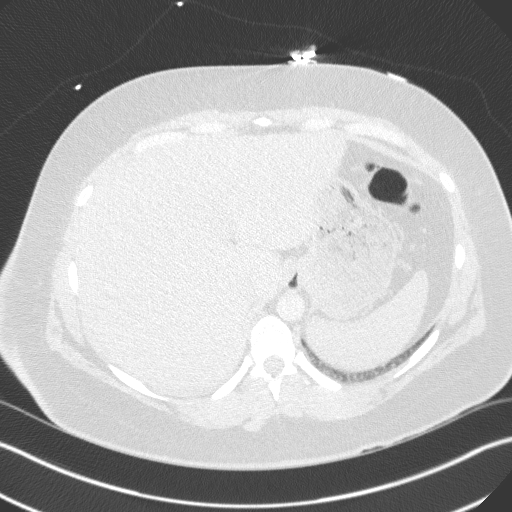
[im 67/250  soft-tissue]
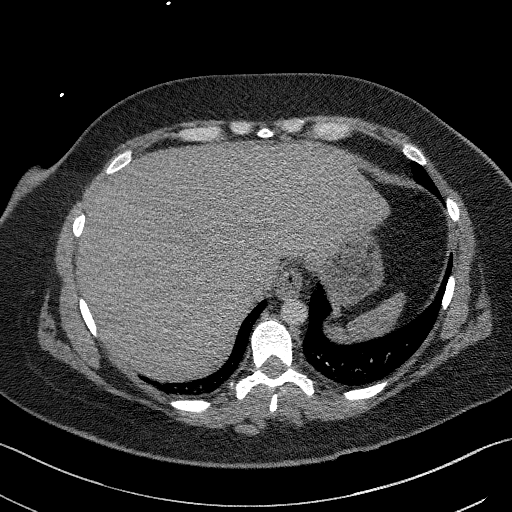
[im 84/250  lung]
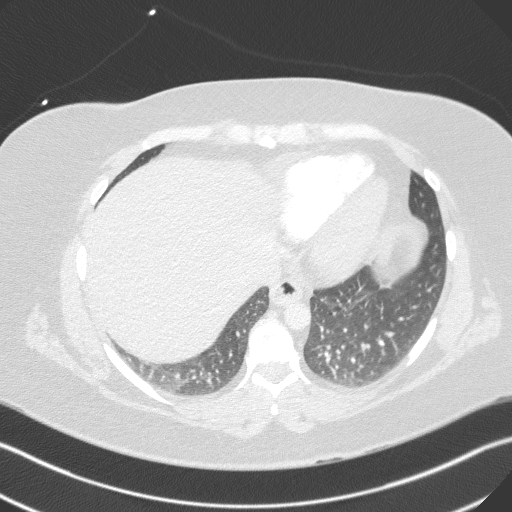
[im 100/250  soft-tissue]
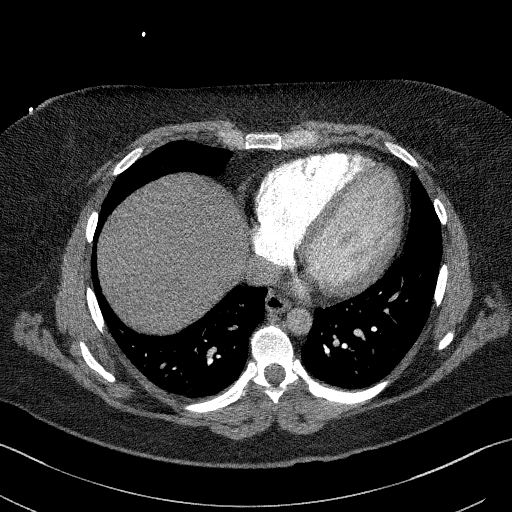
[im 133/250  lung]
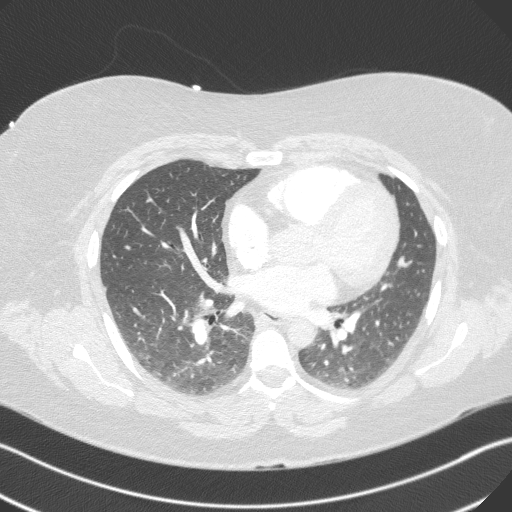
[im 150/250  soft-tissue]
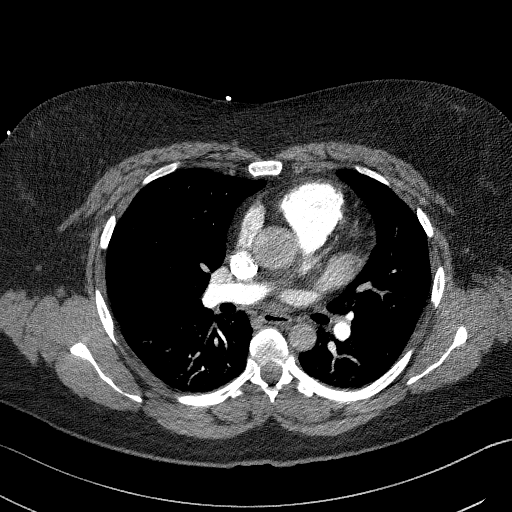
[im 167/250  lung]
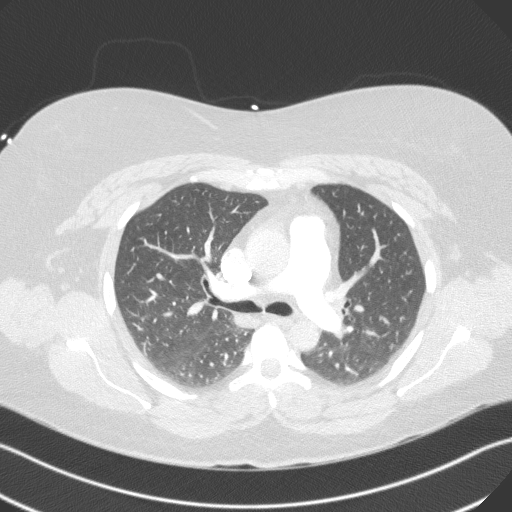
[im 183/250  soft-tissue]
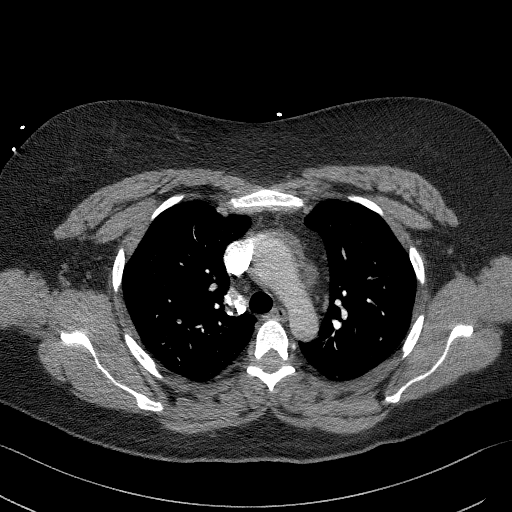
[im 200/250  lung]
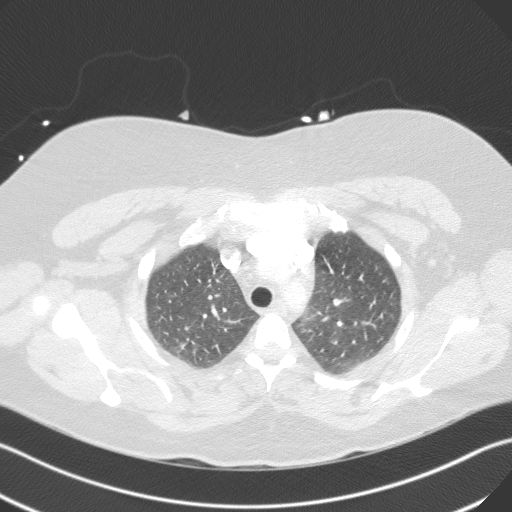
[im 216/250  soft-tissue]
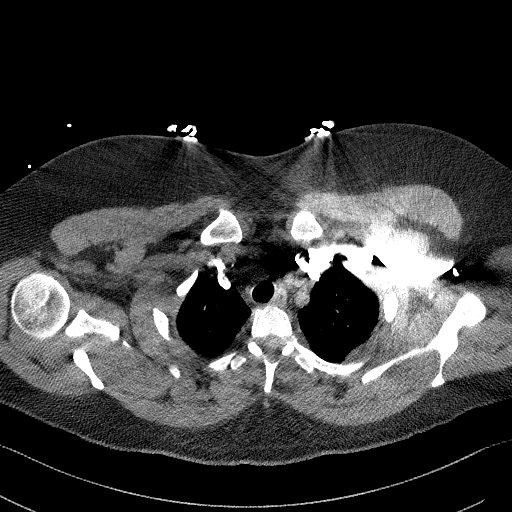
[im 233/250  lung]
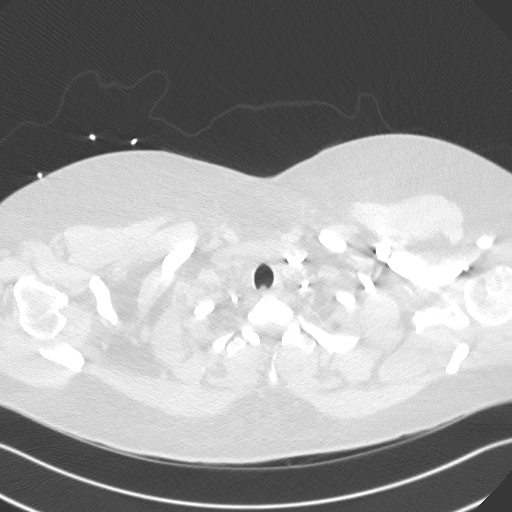

[Series 7: cor soft · coronal · 0.49mm/px · 3 of 127 slices shown]
[im 32/127  soft-tissue]
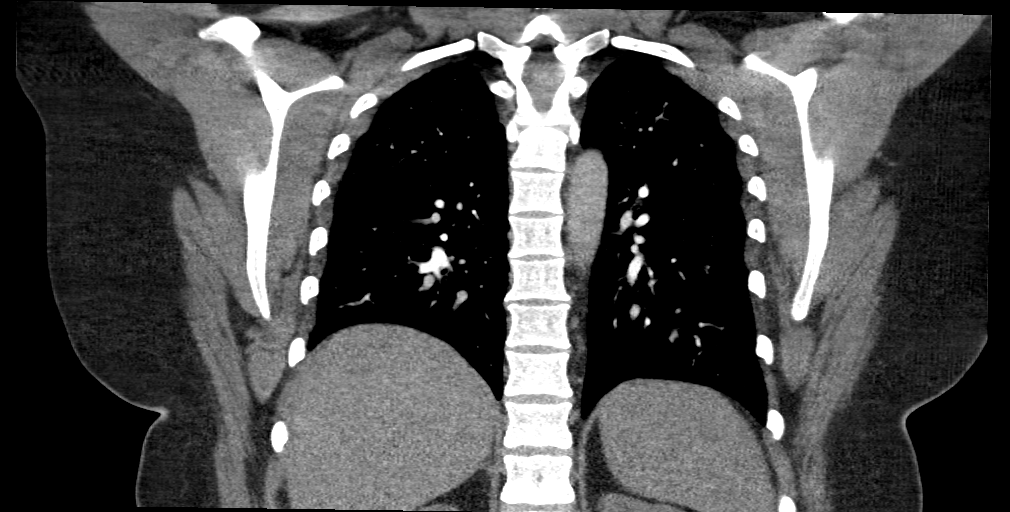
[im 64/127  soft-tissue]
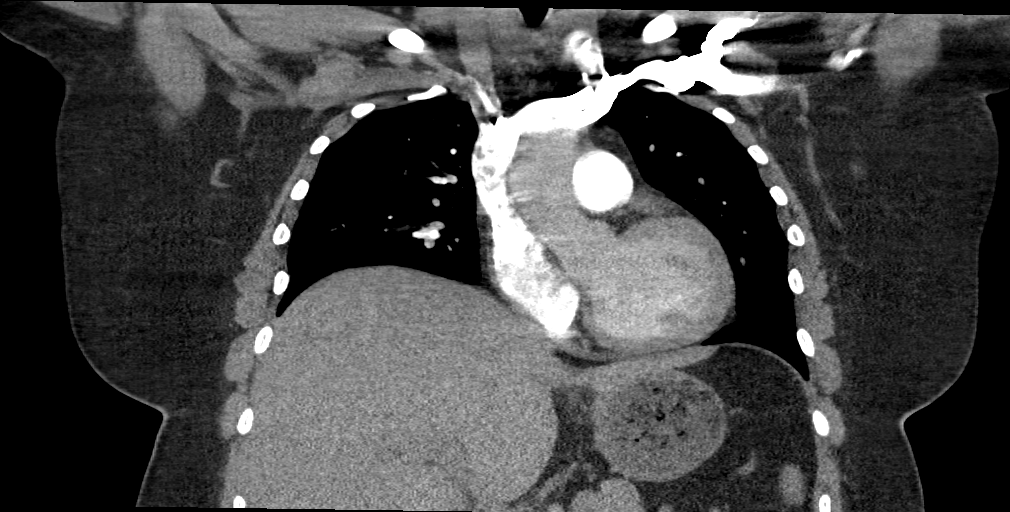
[im 95/127  soft-tissue]
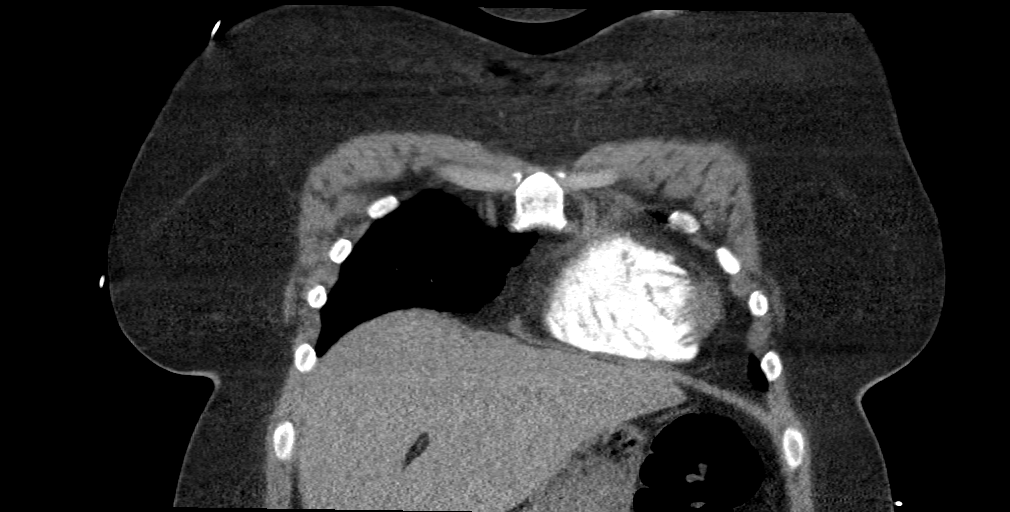

[16 of 46 positions shown; findings below may reference images not displayed]

RADIATION DOSE REDUCTION: This exam was performed according to the
departmental dose-optimization program which includes automated
exposure control, adjustment of the mA and/or kV according to
patient size and/or use of iterative reconstruction technique.

CONTRAST:  77mL OMNIPAQUE IOHEXOL 350 MG/ML SOLN
FINDINGS: Cardiovascular: Satisfactory opacification of the pulmonary arteries
to the segmental level. No evidence of pulmonary embolism. Normal
heart size. No pericardial effusion. Thoracic aorta is normal in
course and caliber.

Mediastinum/Nodes: No enlarged mediastinal, hilar, or axillary lymph
nodes. Thyroid gland, trachea, and esophagus demonstrate no
significant findings.

Lungs/Pleura: Minimal passive atelectasis within the dependent lung
fields. No focal airspace consolidation. No pleural effusion or
pneumothorax.

Upper Abdomen: No acute abnormality.

Musculoskeletal: No chest wall abnormality. No acute or significant
osseous findings.

Review of the MIP images confirms the above findings.
IMPRESSION: No acute pulmonary embolism or other acute intrathoracic findings.

## 2023-01-04 NOTE — Telephone Encounter (Signed)
NFN

## 2023-01-15 ENCOUNTER — Telehealth: Payer: Self-pay

## 2023-01-15 NOTE — Telephone Encounter (Signed)
Attempted to reach patient regarding message left with answering service on 01/12/23. Left voicemail for patient to call office or send mychart message with details regarding appointment needs.

## 2023-01-22 ENCOUNTER — Encounter (HOSPITAL_COMMUNITY): Payer: Self-pay

## 2023-01-22 ENCOUNTER — Ambulatory Visit (HOSPITAL_COMMUNITY)
Admission: RE | Admit: 2023-01-22 | Discharge: 2023-01-22 | Disposition: A | Discharge status: Home / Self Care | Payer: BC Managed Care – PPO | Source: Ambulatory Visit | Attending: Internal Medicine | Admitting: Internal Medicine

## 2023-01-22 VITALS — BP 100/71 | HR 96 | Temp 98.0°F | Resp 12

## 2023-01-22 DIAGNOSIS — J069 Acute upper respiratory infection, unspecified: Secondary | ICD-10-CM | POA: Diagnosis present

## 2023-01-22 DIAGNOSIS — J029 Acute pharyngitis, unspecified: Secondary | ICD-10-CM

## 2023-01-22 DIAGNOSIS — U071 COVID-19: Secondary | ICD-10-CM | POA: Diagnosis present

## 2023-01-22 MED ORDER — IBUPROFEN 800 MG PO TABS
ORAL_TABLET | ORAL | Status: AC
  Filled 2023-01-22: qty 1

## 2023-01-22 MED ORDER — IBUPROFEN 800 MG PO TABS
800.0000 mg | ORAL_TABLET | Freq: Once | ORAL | Status: AC
  Administered 2023-01-22: 800 mg via ORAL

## 2023-01-22 MED ORDER — PROMETHAZINE-DM 6.25-15 MG/5ML PO SYRP: 5.0000 mL | ORAL_SOLUTION | Freq: Every evening | ORAL | 0 refills | Status: DC | PRN

## 2023-01-22 MED ORDER — BENZONATATE 100 MG PO CAPS: 100.0000 mg | ORAL_CAPSULE | Freq: Three times a day (TID) | ORAL | 0 refills | Status: DC

## 2023-01-22 NOTE — ED Triage Notes (Signed)
Pt tested positive for Covid on 01/20/2023, pt has a sore throat and here for confirmation .

## 2023-01-22 NOTE — ED Provider Notes (Signed)
[facility]    CSN: [id number] Arrival date & time: 01/22/23  1401      History   Chief Complaint Chief Complaint  Patient presents with  . Sore Throat    I have tested positive on an at-home antigen test for COVID but, due to the frequency of viral infections, my doctors have asked me to get a PCR test for confirmation. - Entered by patient  . Covid Positive    HPI April Salas is a 37 y.o. adult.   Patient presents to urgent care for evaluation of sore throat and positive COVID-19   Cher Nakai [id number]     Sore Throat   Past Medical History:  Diagnosis Date  . Depression   . Ehlers-Danlos syndrome   . Melanoma ([place])   . OCD (obsessive compulsive disorder)   . Thyroid disease   . Vitiligo     Patient Active Problem List   Diagnosis Date Noted  . COVID-19 long hauler 11/28/2022  . Recurrent infections 11/28/2022  . Dyspnea on exertion 08/09/2022  . Snoring 08/09/2022  . 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 Hypothyroidism 10/14/2021  . 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 Posttraumatic stress disorder 10/14/2021  . Vitamin D deficiency 10/14/2021  . Mononucleosis 07/29/2021  . EDS (Ehlers-Danlos syndrome) 01/21/2020    Past Surgical History:  Procedure Laterality Date  . ABLATION    . FOOT SURGERY    . GALLBLADDER SURGERY    . REVISION OF SCAR ON FACE/HEAD    . SKIN CANCER EXCISION      OB History     Gravida  1   Para      Term      Preterm      AB  1   Living         SAB  1   IAB      Ectopic      Multiple      Live Births               Home Medications    Prior to Admission medications   Medication Sig Start Date End Date Taking? Authorizing Provider  b complex vitamins capsule Take 1 capsule by mouth daily.   Yes [provider]  clonazePAM (KLONOPIN) 0.5 MG tablet Take 0.5 mg by mouth 2 (two) times daily as needed. 06/10/21  Yes [provider]  DULoxetine (CYMBALTA) 30 MG capsule Take 60 mg by mouth daily. 06/13/21  Yes [provider]  lamoTRIgine (LAMICTAL) 100 MG tablet Take 100 mg by mouth daily.   Yes [provider]  Levothyroxine Sodium 125 MCG CAPS Take 125 mcg by mouth daily before breakfast.   Yes [provider]  Omega-3 Fatty Acids (FISH OIL PO) Take by mouth.   Yes [provider]  Turmeric (QC TUMERIC COMPLEX PO) Take by mouth.   Yes [provider]  Vitamin D, Ergocalciferol, (DRISDOL) 1.25 MG (50000 UNIT) CAPS capsule Take by mouth. 01/13/21  Yes [provider]  albuterol (VENTOLIN HFA) 108 (90 Base) MCG/ACT inhaler Inhale 2 puffs into the lungs as needed. Every 4-6 hours Patient not taking: Reported on 11/28/2022 04/18/22   [provider]  fluticasone furoate-vilanterol (BREO ELLIPTA) 200-25 MCG/ACT AEPB Inhale 1 puff into the lungs daily. 06/24/22   [provider]  gabapentin (NEURONTIN) 300 MG capsule Take 300 mg by mouth 2 (two) times daily as needed.  [provider]    Family History History reviewed. No pertinent family history.  Social History Social History   Tobacco Use  . Smoking status: Never  . Smokeless tobacco: Never  Substance Use Topics  . Alcohol use: Not Currently  . Drug use: Yes    Types: Marijuana     Allergies   Hydrocodone-acetaminophen and Hydrocodone   Review of Systems Review of Systems   Physical Exam Triage Vital Signs ED Triage Vitals  Enc Vitals Group     BP 01/22/23 1428 100/71     Pulse Rate 01/22/23 1428 96     Resp 01/22/23 1428 12     Temp 01/22/23 1428 98 F (36.7 C)     Temp Source 01/22/23 1428 Oral     SpO2 01/22/23 1428 95 %     Weight --      Height --      Head Circumference --      Peak Flow --      Pain Score 01/22/23 1422 5     Pain Loc --      Pain Edu? --      Excl. in [place]? --    No data found.  Updated Vital Signs BP 100/71 (BP Location:  Left Arm)   Pulse 96   Temp 98 F (36.7 C) (Oral)   Resp 12   LMP  (LMP Unknown)   SpO2 95%   Visual Acuity Right Eye Distance:   Left Eye Distance:   Bilateral Distance:    Right Eye Near:   Left Eye Near:    Bilateral Near:     Physical Exam   UC Treatments / Results  Labs (all labs ordered are listed, but only abnormal results are displayed) Labs Reviewed  SARS CORONAVIRUS 2 (TAT 6-24 HRS)    EKG   Radiology No results found.  Procedures Procedures (including critical care time)  Medications Ordered in UC Medications - No data to display  Initial Impression / Assessment and Plan / UC Course  I have reviewed the triage vital signs and the nursing notes.  Pertinent labs & imaging results that were available during my care of the patient were reviewed by me and considered in my medical decision making (see chart for details).     *** Final Clinical Impressions(s) / UC Diagnoses   Final diagnoses:  [id number]   Discharge Instructions   None    ED Prescriptions   None    PDMP not reviewed this encounter.

## 2023-01-22 NOTE — Discharge Instructions (Addendum)
You have a viral upper respiratory infection.  COVID-19 PCR testing is pending. We will call you with results if positive. If your COVID test is positive, you must stay at home until day 6 of symptoms. On day 6, you may go out into public and go back to work, but you must wear a mask until day 11 of symptoms to prevent spread to others.  If your COVID-19 PCR test is positive, I will call in Paxlovid for you to take.  Use the following medicines to help with symptoms: - Plain Mucinex (guaifenesin) over the counter as directed every 12 hours to thin mucous so that you are able to get it out of your body easier. Drink plenty of water while taking this medication so that it works well in your body (at least 8 cups a day).  - Tylenol 1,026m and/or ibuprofen 6071mevery 6 hours with food as needed for aches/pains or fever/chills.  - Tessalon perles every 8 hours as needed for cough. - Take Promethazine DM cough medication to help with your cough at nighttime so that you are able to sleep. Do not drive, drink alcohol, or go to work while taking this medication since it can make you sleepy. Only take this at nighttime.   1 tablespoon of honey in warm water and/or salt water gargles may also help with symptoms. Humidifier to your room will help add water to the air and reduce coughing.  If you develop any new or worsening symptoms, please return.  If your symptoms are severe, please go to the emergency room.  Follow-up with your primary care provider for further evaluation and management of your symptoms as well as ongoing wellness visits.  I hope you feel better!

## 2023-01-23 ENCOUNTER — Telehealth (HOSPITAL_COMMUNITY): Payer: Self-pay | Admitting: Internal Medicine

## 2023-01-23 DIAGNOSIS — U071 COVID-19: Secondary | ICD-10-CM

## 2023-01-23 LAB — SARS CORONAVIRUS 2 (TAT 6-24 HRS): SARS Coronavirus 2: POSITIVE — AB

## 2023-01-23 MED ORDER — PAXLOVID (300/100) 20 X 150 MG & 10 X 100MG PO TBPK: 3.0000 | ORAL_TABLET | Freq: Two times a day (BID) | ORAL | 0 refills | Status: AC

## 2023-01-23 NOTE — Telephone Encounter (Signed)
Attempted to reach patient x1, left voicemail.  COVID-19 PCR test is positive, therefore Paxlovid sent to pharmacy.  Quarantine guidelines discussed at visit.

## 2023-04-28 ENCOUNTER — Telehealth: Payer: BC Managed Care – PPO

## 2023-04-30 ENCOUNTER — Telehealth: Payer: Self-pay

## 2023-04-30 ENCOUNTER — Other Ambulatory Visit: Payer: Self-pay

## 2023-04-30 DIAGNOSIS — N6320 Unspecified lump in the left breast, unspecified quadrant: Secondary | ICD-10-CM

## 2023-04-30 NOTE — Progress Notes (Signed)
[facility]  Pt c/o 2 masses in Left Breast.

## 2023-04-30 NOTE — Telephone Encounter (Signed)
Addendum  Pt was not ava lvm.

## 2023-04-30 NOTE — Telephone Encounter (Signed)
TC to pt to make her aware her request for a Breast U/S was made known to Dr.Newton orders were placed for U/S and Diagnostic Mammogram as [facility] advised,

## 2023-04-30 NOTE — Telephone Encounter (Signed)
Left message for pt with mammo / [place] appt with rescheduling number / no show fee / arrival time.

## 2023-05-14 ENCOUNTER — Other Ambulatory Visit: Payer: BC Managed Care – PPO

## 2023-05-18 ENCOUNTER — Encounter: Payer: Self-pay | Admitting: Family Medicine

## 2023-05-18 ENCOUNTER — Ambulatory Visit (INDEPENDENT_AMBULATORY_CARE_PROVIDER_SITE_OTHER): Payer: BC Managed Care – PPO | Admitting: Family Medicine

## 2023-05-18 ENCOUNTER — Other Ambulatory Visit (HOSPITAL_COMMUNITY)
Admission: RE | Admit: 2023-05-18 | Discharge: 2023-05-18 | Disposition: A | Discharge status: Home / Self Care | Payer: BC Managed Care – PPO | Source: Ambulatory Visit | Attending: Family Medicine | Admitting: Family Medicine

## 2023-05-18 VITALS — BP 120/83 | HR 77 | Ht 66.0 in

## 2023-05-18 DIAGNOSIS — Z01419 Encounter for gynecological examination (general) (routine) without abnormal findings: Secondary | ICD-10-CM

## 2023-05-18 DIAGNOSIS — Z1339 Encounter for screening examination for other mental health and behavioral disorders: Secondary | ICD-10-CM

## 2023-05-18 DIAGNOSIS — Z113 Encounter for screening for infections with a predominantly sexual mode of transmission: Secondary | ICD-10-CM

## 2023-05-18 DIAGNOSIS — N898 Other specified noninflammatory disorders of vagina: Secondary | ICD-10-CM

## 2023-05-18 MED ORDER — METRONIDAZOLE 500 MG PO TABS: 500.0000 mg | ORAL_TABLET | Freq: Two times a day (BID) | ORAL | 0 refills | Status: DC

## 2023-05-18 MED ORDER — FLUCONAZOLE 150 MG PO TABS: 150.0000 mg | ORAL_TABLET | Freq: Once | ORAL | 3 refills | Status: AC

## 2023-05-18 NOTE — Progress Notes (Signed)
Patient presents for Annual.  LMP: Ablation Last pap:  today  Mammogram:  upcoming and Breast U/S  STD Screening: Accepts Flu Vaccine : N/A  CC:  2 tender spots in left breast unsure if hormonal notes discomfort.   Pt will be having surgery this summer. Dr.Rebecca Knackstedt at [facility] (Top surgery Full double Mascetomy.)  Fun Fact: Patient has up coming trip to [place] .

## 2023-05-18 NOTE — Progress Notes (Signed)
GYNECOLOGY ANNUAL PREVENTATIVE CARE ENCOUNTER NOTE  Subjective:   April Salas is a 37 y.o. G1P0010 adult here for a routine annual gynecologic exam.  Current complaints: masses in breast, left side. Reports two lumps. Planning on top surgery soon and has appt scheduled for imaging of masses.  Patient currently teaches at [employer], writing.  Partner is no sperm producing/female.  They are planning travel this summer.  She and partner are in consensual open relationship. Desires STI screening.   Denies abnormal vaginal bleeding, discharge, pelvic pain, problems with intercourse or other gynecologic concerns.    Gynecologic History No LMP recorded. Patient has had an ablation. Contraception:  s/p ablation Last Pap: Today.  Last mammogram: pending  Health Maintenance Due  Topic Date Due   HIV Screening  Never done   Hepatitis C Screening  Never done   PAP SMEAR-Modifier  Never done   COVID-19 Vaccine (5 - 2023-24 season) 08/11/2022    The following portions of the patient's history were reviewed and updated as appropriate: allergies, current medications, past family history, past medical history, past social history, past surgical history and problem list.  Review of Systems Pertinent items are noted in HPI.   Objective:  BP 120/83   Pulse 77   Ht 5\' 6"  (1.676 m)   BMI 37.12 kg/m  CONSTITUTIONAL: Well-developed, well-nourished female in no acute distress.  HENT:  Normocephalic, atraumatic, External right and left ear normal. Oropharynx is clear and moist EYES:  No scleral icterus.  NECK: Normal range of motion, supple, no masses.  Normal thyroid.  SKIN: Skin is warm and dry. No rash noted. Not diaphoretic. No erythema. No pallor. NEUROLOGIC: Alert and oriented to person, place, and time. Normal reflexes, muscle tone coordination. No cranial nerve deficit noted. PSYCHIATRIC: Normal mood and affect. Normal behavior. Normal judgment and thought content. CARDIOVASCULAR: Normal  heart rate noted, regular rhythm. 2+ distal pulses. RESPIRATORY: Effort and breath sounds normal, no problems with respiration noted. BREASTS: Symmetric in size. No masses, skin changes, nipple drainage, or lymphadenopathy. LEFT: small 1cm oval rubbery feeling mass located at 2 and 8 oclock.  ABDOMEN: Soft,  no distention noted.  No tenderness, rebound or guarding.  PELVIC: Normal appearing external genitalia; normal appearing vaginal mucosa and cervix.  No abnormal discharge noted.  Pap smear obtained.  Normal uterine size, no other palpable masses, no uterine or adnexal tenderness. Chaperone present for exam MUSCULOSKELETAL: Normal range of motion.    Assessment and Plan:  1) Annual gynecologic examination with pap smear:  Will follow up results of pap smear and manage accordingly. STI screen also ordered today.  Routine preventative health maintenance measures emphasized.  2) Contraception counseling: s/p ablation. Partner is non sperm producing.   1. Well woman exam with routine gynecological exam - Cervicovaginal ancillary only - Cytology - PAP - HIV Antibody (routine testing w rflx) - RPR - Hepatitis C antibody - fluconazole (DIFLUCAN) 150 MG tablet; Take 1 tablet (150 mg total) by mouth once for 1 dose. Can take additional dose three days later if symptoms persist  Dispense: 1 tablet; Refill: 3 - metroNIDAZOLE (FLAGYL) 500 MG tablet; Take 1 tablet (500 mg total) by mouth 2 (two) times daily. Start if swab from 6/7 shows bacterial vaginosis  Dispense: 14 tablet; Refill: 0  2. Screening examination for venereal disease - HIV Antibody (routine testing w rflx) - RPR - Hepatitis C antibody - fluconazole (DIFLUCAN) 150 MG tablet; Take 1 tablet (150 mg total) by mouth once for  1 dose. Can take additional dose three days later if symptoms persist  Dispense: 1 tablet; Refill: 3 - metroNIDAZOLE (FLAGYL) 500 MG tablet; Take 1 tablet (500 mg total) by mouth 2 (two) times daily. Start if swab  from 6/7 shows bacterial vaginosis  Dispense: 14 tablet; Refill: 0 - Hepatitis B surface antigen  3. Vaginal discharge - fluconazole (DIFLUCAN) 150 MG tablet; Take 1 tablet (150 mg total) by mouth once for 1 dose. Can take additional dose three days later if symptoms persist  Dispense: 1 tablet; Refill: 3 - metroNIDAZOLE (FLAGYL) 500 MG tablet; Take 1 tablet (500 mg total) by mouth 2 (two) times daily. Start if swab from 6/7 shows bacterial vaginosis  Dispense: 14 tablet; Refill: 0   Please refer to After Visit Summary for other counseling recommendations.   No follow-ups on file.  Federico Flake, MD, MPH, ABFM Attending Physician Center for [facility]

## 2023-05-19 LAB — HEPATITIS B SURFACE ANTIGEN: Hepatitis B Surface Ag: NEGATIVE

## 2023-05-19 LAB — HEPATITIS C ANTIBODY: Hep C Virus Ab: NONREACTIVE

## 2023-05-19 LAB — HIV ANTIBODY (ROUTINE TESTING W REFLEX): HIV Screen 4th Generation wRfx: NONREACTIVE

## 2023-05-19 LAB — RPR: RPR Ser Ql: NONREACTIVE

## 2023-05-21 ENCOUNTER — Ambulatory Visit
Admission: RE | Admit: 2023-05-21 | Discharge: 2023-05-21 | Disposition: A | Discharge status: Home / Self Care | Payer: BC Managed Care – PPO | Source: Ambulatory Visit | Attending: Family Medicine | Admitting: Family Medicine

## 2023-05-21 DIAGNOSIS — N6320 Unspecified lump in the left breast, unspecified quadrant: Secondary | ICD-10-CM

## 2023-05-22 ENCOUNTER — Emergency Department (HOSPITAL_BASED_OUTPATIENT_CLINIC_OR_DEPARTMENT_OTHER): Payer: BC Managed Care – PPO

## 2023-05-22 ENCOUNTER — Emergency Department (HOSPITAL_BASED_OUTPATIENT_CLINIC_OR_DEPARTMENT_OTHER)
Admission: EM | Admit: 2023-05-22 | Discharge: 2023-05-22 | Disposition: A | Discharge status: Home / Self Care | Payer: BC Managed Care – PPO | Attending: Emergency Medicine | Admitting: Emergency Medicine

## 2023-05-22 ENCOUNTER — Encounter (HOSPITAL_BASED_OUTPATIENT_CLINIC_OR_DEPARTMENT_OTHER): Payer: Self-pay | Admitting: Emergency Medicine

## 2023-05-22 ENCOUNTER — Other Ambulatory Visit: Payer: Self-pay

## 2023-05-22 DIAGNOSIS — M542 Cervicalgia: Secondary | ICD-10-CM | POA: Diagnosis present

## 2023-05-22 DIAGNOSIS — S161XXA Strain of muscle, fascia and tendon at neck level, initial encounter: Secondary | ICD-10-CM | POA: Insufficient documentation

## 2023-05-22 DIAGNOSIS — Y92410 Unspecified street and highway as the place of occurrence of the external cause: Secondary | ICD-10-CM | POA: Diagnosis not present

## 2023-05-22 LAB — CERVICOVAGINAL ANCILLARY ONLY
Bacterial Vaginitis (gardnerella): NEGATIVE
Candida Glabrata: NEGATIVE
Candida Vaginitis: NEGATIVE
Chlamydia: NEGATIVE
Comment: NEGATIVE
Comment: NEGATIVE
Comment: NEGATIVE
Comment: NEGATIVE
Comment: NEGATIVE
Comment: NORMAL
Neisseria Gonorrhea: NEGATIVE
Trichomonas: NEGATIVE

## 2023-05-22 MED ORDER — CYCLOBENZAPRINE HCL 10 MG PO TABS: 10.0000 mg | ORAL_TABLET | Freq: Two times a day (BID) | ORAL | 0 refills | Status: DC | PRN

## 2023-05-22 MED ORDER — OXYCODONE-ACETAMINOPHEN 5-325 MG PO TABS: 1.0000 | ORAL_TABLET | Freq: Four times a day (QID) | ORAL | 0 refills | Status: DC | PRN

## 2023-05-22 MED ORDER — CYCLOBENZAPRINE HCL 5 MG PO TABS
5.0000 mg | ORAL_TABLET | Freq: Once | ORAL | Status: AC
  Administered 2023-05-22: 5 mg via ORAL
  Filled 2023-05-22: qty 1
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
## 2023-05-22 NOTE — ED Notes (Signed)
Patient verbalizes understanding of discharge instructions. Opportunity for questioning and answers were provided. Patient discharged from ED.  °
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
## 2023-05-24 LAB — CYTOLOGY - PAP
Comment: NEGATIVE
Diagnosis: NEGATIVE
High risk HPV: NEGATIVE

## 2023-09-28 ENCOUNTER — Other Ambulatory Visit (HOSPITAL_COMMUNITY)
Admission: RE | Admit: 2023-09-28 | Discharge: 2023-09-28 | Disposition: A | Discharge status: Home / Self Care | Payer: BC Managed Care – PPO | Source: Ambulatory Visit | Attending: Family Medicine | Admitting: Family Medicine

## 2023-09-28 ENCOUNTER — Ambulatory Visit: Payer: BC Managed Care – PPO

## 2023-09-28 VITALS — BP 111/75 | HR 80

## 2023-09-28 DIAGNOSIS — Z113 Encounter for screening for infections with a predominantly sexual mode of transmission: Secondary | ICD-10-CM | POA: Diagnosis present

## 2023-09-28 NOTE — Progress Notes (Signed)
SUBJECTIVE:  37 y.o. Patient who desires a STI screen for new sexual partner.  Denies abnormal vaginal discharge, bleeding or significant pelvic pain. No UTI symptoms. Denies history of known exposure to STD.   No LMP recorded. Patient has had an ablation.  OBJECTIVE:  She appears well.   ASSESSMENT:  STI Screen   PLAN:  Pt offered STI blood screening-requested GC, chlamydia, and trichomonas probe sent to lab.  Treatment: To be determined once lab results are received.  Pt follow up as needed.

## 2023-09-29 LAB — RPR+HBSAG+HCVAB+...
HIV Screen 4th Generation wRfx: NONREACTIVE
Hep C Virus Ab: NONREACTIVE
Hepatitis B Surface Ag: NEGATIVE
RPR Ser Ql: NONREACTIVE

## 2023-10-01 LAB — CERVICOVAGINAL ANCILLARY ONLY
Chlamydia: NEGATIVE
Comment: NEGATIVE
Comment: NEGATIVE
Comment: NORMAL
Neisseria Gonorrhea: NEGATIVE
Trichomonas: NEGATIVE

## 2023-10-18 ENCOUNTER — Other Ambulatory Visit (HOSPITAL_COMMUNITY)
Admission: RE | Admit: 2023-10-18 | Discharge: 2023-10-18 | Disposition: A | Discharge status: Home / Self Care | Payer: BC Managed Care – PPO | Source: Ambulatory Visit | Attending: Family Medicine | Admitting: Family Medicine

## 2023-10-18 ENCOUNTER — Ambulatory Visit: Payer: BC Managed Care – PPO

## 2023-10-18 ENCOUNTER — Telehealth: Payer: Self-pay

## 2023-10-18 DIAGNOSIS — Z202 Contact with and (suspected) exposure to infections with a predominantly sexual mode of transmission: Secondary | ICD-10-CM

## 2023-10-18 NOTE — Telephone Encounter (Signed)
TC to pt regarding recent Exposure to syphilis and appt.Dr.A confirmed pt can [facility] to [facility] for screening and treatment by Appt only.   Pt made aware still wants to come in for labs here and then reach out to [facility] de[facility] for treatment.

## 2023-10-18 NOTE — Progress Notes (Signed)
SUBJECTIVE:  37 y.o. female who desires a STI screen. Denies abnormal vaginal discharge, bleeding or significant pelvic pain. No UTI symptoms. Possible exposure to STD. Pt requesting testing for possible syphilis   No LMP recorded. Patient has had an ablation.  OBJECTIVE:  She appears well.   ASSESSMENT:  STI Screen   PLAN:  Pt offered STI blood screening-requested GC, chlamydia, and trichomonas probe sent to lab.  Treatment: To be determined once lab results are received.  Pt follow up as needed.

## 2023-10-19 LAB — RPR+HBSAG+HCVAB+...
HIV Screen 4th Generation wRfx: NONREACTIVE
Hep C Virus Ab: NONREACTIVE
Hepatitis B Surface Ag: NEGATIVE
RPR Ser Ql: NONREACTIVE

## 2023-10-22 LAB — CERVICOVAGINAL ANCILLARY ONLY
Bacterial Vaginitis (gardnerella): NEGATIVE
Candida Glabrata: NEGATIVE
Candida Vaginitis: NEGATIVE
Chlamydia: NEGATIVE
Comment: NEGATIVE
Comment: NEGATIVE
Comment: NEGATIVE
Comment: NEGATIVE
Comment: NEGATIVE
Comment: NORMAL
Neisseria Gonorrhea: NEGATIVE
Trichomonas: NEGATIVE

## 2023-12-10 NOTE — Procedures (Signed)
 Operative Note   SURGERY DATE: 12/10/2023  PRE-OP DIAGNOSIS: S/P bilateral mastectomy [Z90.13] Hypertrophic scar [L91.0]    POST-OP DIAGNOSIS: Post-Op Diagnosis Codes:    * S/P bilateral mastectomy [Z90.13]    * Hypertrophic scar [L91.0]   Procedure(s): BILATERAL CHEST WALL SCAR REVISION WITH COMPLEX CLOSURE (Bilateral) REPAIR, COMPLEX, TRUNK; EACH ADDITIONAL 5 CM OR LESS (LIST IN ADDITION TO PRIMARY PROCEDURE) (Bilateral)  SURGEON: Surgeons and Role:    * Knackstedt, Asberry Patient, MD - Primary    * Cyrena Cambric, [place] - Assistant  STAFF: Circulator: Rian App, RN; Sandlin, Mary, RN Scrub Person: Quintin Pao  ANESTHESIA: * No anesthesia type entered *   INDICATION(S): This is a 37 year old nonbinary adult who previously underwent gender affirmation top surgery.  They present today for bilateral scar revision.  Preoperatively, the risks and benefits were addressed and consent was signed.  OPERATIVE FINDINGS:  Left chest scar revision 1.5 cm medial, 18 cm lateral with complex closure Right chest revision 21 cm lateral with complex closure  OPERATION: The patient was taken to the operating room and after successful induction of a general anesthetic, a time-out was performed, and the entire chest was prepped and draped in the usual sterile manner.  Sequential compression devices and preoperative antibiotics were provided.   Preoperatively, the areas of excision have been marked.  We began with the left chest where there was a medial and lateral scar revision.  Medially, an ellipse was made that was 1.5 cm in length.  The scar was removed and the underlying tissue defatted.  We then turned our attention to the lateral chest where the dogear had been marked.  This was removed full-thickness and circumferentially defatted.  The length of this was 18 cm.  We then confirmed hemostasis.  The incision was closed in layers.  The deep tissue was reapproximated with 2-0 Vicryl.  The skin was  closed with 3-0 Monocryl and the subcuticular was performed with a 4 OV lock. This was repeated on the right side.  The length of the incision was 21 cm. An Exparel timeout was conducted and a total of 20 cc of Exparel and 20 cc of quarter percent Marcaine was brought to the field and injected into the bilateral chest. The patient's chest was cleaned and prineo was placed on the incisions.  They were  awakened, extubated in the operating room and transferred to a gurney, They were then transferred to the recovery room in stable condition having tolerated the procedure well.  I, Dr. Knackstedt, was present for the entire procedure.   ESTIMATED BLOOD LOSS: minimal   * No values recorded between 12/10/2023  9:23 AM and 12/10/2023 10:42 AM *  SPECIMENS:  * No specimens in log *   IMPLANTS:  * No implants in log *   COMPLICATIONS: none  DISPOSITION: PACU - hemodynamically stable.  ATTESTATION:  TP- Surgery (With Asst Surgeon) - I personally performed the procedure with the assistance of Cambric Cyrena as an assistant surgeon/assistant at surgery as a qualified resident was not available for this procedure (TP).  Asberry Boeck, MD, PhD

## 2023-12-13 ENCOUNTER — Other Ambulatory Visit (HOSPITAL_COMMUNITY)
Admission: RE | Admit: 2023-12-13 | Discharge: 2023-12-13 | Disposition: A | Discharge status: Home / Self Care | Payer: 59 | Source: Ambulatory Visit | Attending: Obstetrics and Gynecology | Admitting: Obstetrics and Gynecology

## 2023-12-13 ENCOUNTER — Ambulatory Visit (INDEPENDENT_AMBULATORY_CARE_PROVIDER_SITE_OTHER): Payer: 59

## 2023-12-13 DIAGNOSIS — Z113 Encounter for screening for infections with a predominantly sexual mode of transmission: Secondary | ICD-10-CM | POA: Diagnosis present

## 2023-12-13 DIAGNOSIS — R300 Dysuria: Secondary | ICD-10-CM | POA: Diagnosis not present

## 2023-12-13 LAB — POCT URINALYSIS DIPSTICK
Blood, UA: NEGATIVE
Glucose, UA: NEGATIVE
Ketones, UA: NEGATIVE
Leukocytes, UA: NEGATIVE

## 2023-12-13 NOTE — Progress Notes (Signed)
 SUBJECTIVE:  38 y.o. female  Requesting STI testing swab and bloodwork  and complaining of dysuria for several days  Denies abnormal vaginal bleeding or significant pelvic pain or fever.Denies history of known exposure to STD.  No LMP recorded. Patient has had an ablation.  OBJECTIVE:   She appears alert, well appearing, in no apparent distress Urine dipstick: negative for all components.  ASSESSMENT:  STI screen Dysuria    PLAN:  GC, chlamydia, trichomonas, BVAG, CVAG probe and urine culture sent to lab. Treatment: To be determined once lab results are received ROV prn if symptoms persist or worsen.

## 2023-12-14 LAB — RPR+HBSAG+HCVAB+...
HIV Screen 4th Generation wRfx: NONREACTIVE
Hep C Virus Ab: NONREACTIVE
Hepatitis B Surface Ag: NEGATIVE
RPR Ser Ql: NONREACTIVE

## 2023-12-14 NOTE — Progress Notes (Signed)
 Patient was assessed and managed by nursing staff during this encounter. I have reviewed the chart and agree with the documentation and plan. I have also made any necessary editorial changes.  [place] Bing, MD 12/14/2023 11:03 AM

## 2023-12-15 LAB — URINE CULTURE: Organism ID, Bacteria: NO GROWTH

## 2023-12-17 ENCOUNTER — Encounter: Payer: Self-pay | Admitting: Obstetrics and Gynecology

## 2023-12-18 LAB — CERVICOVAGINAL ANCILLARY ONLY
Bacterial Vaginitis (gardnerella): NEGATIVE
Candida Glabrata: NEGATIVE
Candida Vaginitis: NEGATIVE
Chlamydia: NEGATIVE
Comment: NEGATIVE
Comment: NEGATIVE
Comment: NEGATIVE
Comment: NEGATIVE
Comment: NEGATIVE
Comment: NORMAL
Neisseria Gonorrhea: NEGATIVE
Trichomonas: NEGATIVE

## 2024-02-04 ENCOUNTER — Encounter: Payer: Self-pay | Admitting: Psychology

## 2024-02-04 ENCOUNTER — Ambulatory Visit (INDEPENDENT_AMBULATORY_CARE_PROVIDER_SITE_OTHER): Payer: 59 | Admitting: Psychology

## 2024-02-04 DIAGNOSIS — F422 Mixed obsessional thoughts and acts: Secondary | ICD-10-CM

## 2024-02-04 DIAGNOSIS — F4310 Post-traumatic stress disorder, unspecified: Secondary | ICD-10-CM
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
## 2024-02-04 NOTE — Progress Notes (Signed)
   April Dames, PhD

## 2024-02-11 ENCOUNTER — Other Ambulatory Visit: Payer: BC Managed Care – PPO | Admitting: Psychology

## 2024-02-13 ENCOUNTER — Other Ambulatory Visit: Payer: BC Managed Care – PPO | Admitting: Psychology

## 2024-02-25 ENCOUNTER — Ambulatory Visit: Payer: BC Managed Care – PPO | Admitting: Psychology

## 2024-05-13 ENCOUNTER — Encounter: Payer: Self-pay | Admitting: Family Medicine

## 2024-05-23 ENCOUNTER — Other Ambulatory Visit (HOSPITAL_COMMUNITY)
Admission: RE | Admit: 2024-05-23 | Discharge: 2024-05-23 | Disposition: A | Discharge status: Home / Self Care | Source: Ambulatory Visit | Attending: Family Medicine | Admitting: Family Medicine

## 2024-05-23 ENCOUNTER — Encounter: Payer: Self-pay | Admitting: Family Medicine

## 2024-05-23 ENCOUNTER — Ambulatory Visit: Admitting: Family Medicine

## 2024-05-23 VITALS — BP 115/81 | HR 76

## 2024-05-23 DIAGNOSIS — Z113 Encounter for screening for infections with a predominantly sexual mode of transmission: Secondary | ICD-10-CM

## 2024-05-23 DIAGNOSIS — M62838 Other muscle spasm: Secondary | ICD-10-CM

## 2024-05-23 DIAGNOSIS — N939 Abnormal uterine and vaginal bleeding, unspecified: Secondary | ICD-10-CM

## 2024-05-23 MED ORDER — NORETHINDRONE 0.35 MG PO TABS: 1.0000 | ORAL_TABLET | Freq: Every day | ORAL | 4 refills | Status: DC

## 2024-05-23 MED ORDER — CYCLOBENZAPRINE HCL 10 MG PO TABS: 10.0000 mg | ORAL_TABLET | Freq: Two times a day (BID) | ORAL | 0 refills | Status: DC | PRN

## 2024-05-23 NOTE — Progress Notes (Signed)
 RGYN here for STD screening and just got first cycle since ablation wants to discuss temp Birth Control.  LMP 05/14/24   Since having period notes vaginal odor

## 2024-05-23 NOTE — Patient Instructions (Signed)
Natural Remedies for Bacterial Vaginosis ° °Option #1 °1 Tbsp Fractitionated Coconut Oil °10 drops of Melaleuca (Tea Tree) Oil ° °Mix ingredients together well.  Soak 3-4 tampons (in applicators) in that mixture until all or mostly all mixture is soaked up into the tampons.  Insert 1 saturated tampon vaginally and wear overnight for 3-4 nights.   ° °Option #2 (sometimes to be used in conjunction with option #1) °Fill tub with enough to cover lap/lower abdomen warm water.  Mix 1/2 cup of baking soda in water.  Soak in water/baking soda mixture for at least 20 minutes.  Be sure to swish water in between legs to get as much in vagina as possible.  This soak should be done after sexual intercourse and menstrual cycles.   ° ° °Option #3 (sometimes to be used in conjunction with option #1 and 2) °Fill tub with enough to cover lap/lower abdomen warm water.  Mix 2-4 cup of apple cider vinegar in water.  Soak in water/vinegar mixture for at least 20 minutes.  Be sure to swish water in between legs to get as much in vagina as possible.  This soak should be done after sexual intercourse and menstrual cycles.   ° °GO WHITE: °Soap: UNSCENTED Dove (white box light green writing) °Laundry detergent (underwear)- Dreft or [employer] n[employer] Hammer unscented °WHITE 100% cotton panties (NOT just cotton crouch) °Sanitary napkin/panty liners: UNSCENTED.  If it doesn't SAY unscented it can have a scent/perfume    °NO PERFUMES OR LOTIONS OR POTIONS in the vulvar area (may use regular KY) °Condoms: hypoallergenic only. Non dyed (no color) °Toilet papers: white only °Wash clothes: use a separate wash cloth. WHITE.  Wash in Dreft.  ° °You can purchase Tea Tree Oil locally at: ° °[employer] °[address] °[place] [address] ° °[employer] [employer] °[address] °[place] [address] ° °Advise that these alternatives will not replace the need to be evaluated if symptoms persist. You will need to seek care at an OB/GYN provider. ° °

## 2024-05-23 NOTE — Progress Notes (Signed)
   GYNECOLOGY PROBLEM  VISIT ENCOUNTER NOTE  Subjective:   April Salas is a 38 y.o. G1P0010 adult here for a problem GYN visit.  Current complaints: started having menses  Pt is s/p ablation in 2021.     Notes discharge and odor since starting cycle  Has first cycle this month with 4 day of bleeding, 1 day with tissue passage Has had 6 months of monthly cramping that does align with partner who does have menses. Prior to ablation had heavy cycles for 7+ days.  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  Reviewed last US  in 2023 which did not show uterine anomalies.   Denies pelvic pain, problems with intercourse or other gynecologic concerns.    Gynecologic History Patient's last menstrual period was 05/14/2024 (approximate).  Contraception: none Previously on OCP when in relationship with cis female-- had SE mood  Health Maintenance Due  Topic Date Due   COVID-19 Vaccine (5 - 2024-25 season) 08/12/2023    The following portions of the patient's history were reviewed and updated as appropriate: allergies, current medications, past family history, past medical history, past social history, past surgical history and problem list.  Review of Systems Pertinent items are noted in HPI.   Objective:  BP 115/81   Pulse 76   LMP 05/14/2024 (Approximate)   Gen: well appearing, NAD HEENT: no scleral icterus CV: RR Lung: Normal WOB Ext: warm well perfused   Assessment and Plan:  1. Screening examination for venereal disease - Cervicovaginal ancillary only( ) - RPR+HBsAg+HCVAb+...  2. Muscle spasm - cyclobenzaprine  (FLEXERIL ) 10 MG tablet; Take 1 tablet (10 mg total) by mouth 2 (two) times daily as needed for muscle spasms (daily).  Dispense: 20 tablet; Refill: 0  3. Abnormal uterine bleeding (AUB) (Primary) Discussed options for menstrual suppression with depo vs POP-- patient has a trip  to [place] and needs more immediate option for cycle suppression Ultimately would not want any potential for menstrual bleeding Would consider repeat ablation vs hysterectomy but does want ovarian preservation Reviewed they are open to HRT in the future if need for sx of menopause.   - norethindrone (MICRONOR) 0.35 MG tablet; Take 1 tablet (0.35 mg total) by mouth daily.  Dispense: 84 tablet; Refill: 4 - US  PELVIC COMPLETE WITH TRANSVAGINAL; Future   Please refer to After Visit Summary for other counseling recommendations.   Return in about 7 weeks (around 07/11/2024) for With Dr Adriana Hopping - AUB after ablation in 2021, consult for Repeat ablation vs hystectomy.  Future Appointments  Date Time Provider Department Center  07/24/2024 10:15 AM Granville Layer, MD [facility]    Abner Ables, MD, MPH, ABFM Attending Physician Faculty Practice- Center for [facility]

## 2024-05-24 ENCOUNTER — Encounter: Payer: Self-pay | Admitting: Family Medicine

## 2024-05-24 LAB — RPR+HBSAG+HCVAB+...
HIV Screen 4th Generation wRfx: NONREACTIVE
Hep C Virus Ab: NONREACTIVE
Hepatitis B Surface Ag: NEGATIVE
RPR Ser Ql: NONREACTIVE

## 2024-05-26 ENCOUNTER — Encounter: Payer: Self-pay | Admitting: Family Medicine

## 2024-05-26 DIAGNOSIS — B9689 Other specified bacterial agents as the cause of diseases classified elsewhere: Secondary | ICD-10-CM

## 2024-05-26 LAB — CERVICOVAGINAL ANCILLARY ONLY
Bacterial Vaginitis (gardnerella): POSITIVE — AB
Candida Glabrata: POSITIVE — AB
Candida Vaginitis: NEGATIVE
Chlamydia: NEGATIVE
Comment: NEGATIVE
Comment: NEGATIVE
Comment: NEGATIVE
Comment: NEGATIVE
Comment: NEGATIVE
Comment: NORMAL
Neisseria Gonorrhea: NEGATIVE
Trichomonas: NEGATIVE

## 2024-05-26 NOTE — Progress Notes (Signed)
  MARITA GARNER, RN  Patient arrived for their  New Patient appointment with ALISA ANNE WILCZYNSKI, PA (referred by Charleen Connell Norris*), accompanied by self only. April Salas  was identified via name and date of birth per protocol and taken to room 08 where the intake was completed at 2:29 PM, 05/26/2024.   Nursing staff assessed height and weight and obtained accurate vital signs.  Height:    Ht Readings from Last 3 Encounters:  03/11/24 167.6 cm (5' 6)  02/29/24 167.6 cm (5' 6)  02/01/24 167.6 cm (5' 6)   Weight:  Wt Readings from Last 3 Encounters:  12/18/23 93 kg (205 lb 0.4 oz)  12/10/23 93.5 kg (206 lb 2.1 oz)  12/06/23 95.7 kg (211 lb)   Vitals:   05/26/24 1423  Pulse: 85  BP: 108/71  BP Location: Right upper arm  Patient Position: Sitting  BP Cuff Size: Large Adult       Patient declined weight to be taken.  Any abnormal vital sign values are as follows: None.  and were reported to [facility], [facility].  Ambulatory aids used by patient are as follows: None.   Active dialogue with patient reviewed chief complaint, falls risk assessment completed for safety needs, allergies were reviewed and updated as needed, a complete medication review was done including updating the preferred pharmacy with the location and contact numbers.  The patient's pain level was assessed with the corresponding pain scale noted in the pain flowsheet.  All questions and concerns were addressed regarding the patient's visit thus far.  Refill needs and personal or clinic paperwork was discussed.     No orders of the defined types were placed in this encounter.     Answers submitted by the patient for this visit: Recent Medical Symptoms (Submitted on 05/23/2024) Night sweats: No Appetite Loss: No Weight loss: No Fever: No Daytime sleepiness: Yes visual change: No Hearing loss: No Sore throat: No Hoarseness: No Cough: No Hemoptysis (Coughing up blood): No Shortness of  breath: No Wheezing: No Chest pain: No Tachycardia (heart racing): No leg pain: No Nausea: No Vomiting: No Diarrhea: No Constipation: No Abdominal pain: No Bowel habits change: No Melena (Black tar-like stool): No Dysuria (Pain with urination): No Frequency (The need to urinate many times a day): No Hesitancy (Difficulty in beginning the flow of urine): No Joint pain: Yes Joint swelling: No Myalgias (Muscle pain): Yes Rash: No Pigment changes/discoloration: No Memory loss: No Syncope (Fainting or passing out): No Extremity weakness: No Paresthesias (Pins and Needles): No Loss of balance: No

## 2024-05-27 ENCOUNTER — Ambulatory Visit: Payer: Self-pay | Admitting: Family Medicine

## 2024-05-27 MED ORDER — BORIC ACID CRYS: 600.0000 mg | CRYSTALS | Freq: Every day | 5 refills | Status: DC

## 2024-05-27 MED ORDER — METRONIDAZOLE 500 MG PO TABS: 500.0000 mg | ORAL_TABLET | Freq: Two times a day (BID) | ORAL | 0 refills | Status: DC

## 2024-05-31 ENCOUNTER — Ambulatory Visit (HOSPITAL_BASED_OUTPATIENT_CLINIC_OR_DEPARTMENT_OTHER)

## 2024-07-24 ENCOUNTER — Ambulatory Visit: Admitting: Family Medicine

## 2024-07-24 ENCOUNTER — Encounter: Payer: Self-pay | Admitting: Family Medicine

## 2024-07-24 VITALS — BP 111/78 | HR 67

## 2024-07-24 DIAGNOSIS — Q796 Ehlers-Danlos syndrome, unspecified: Secondary | ICD-10-CM | POA: Diagnosis not present

## 2024-07-24 DIAGNOSIS — N939 Abnormal uterine and vaginal bleeding, unspecified: Secondary | ICD-10-CM | POA: Insufficient documentation

## 2024-07-24 NOTE — Assessment & Plan Note (Signed)
 Discussed options, to include, meds, IUD, repeat ablation or hysterectomy. I do TVH and they do not want ovarian removal. Discussed R/B of each. 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 They, also, have TVUS scheduled on Sunday, which may or may not shed light on pathology (likely ablation failure, due to age). They have not had a cycle in July or August as yet. They may see how things go and wait on any treatment. Would give vaginal estrogen for several weeks prior to [facility] or TLH/RATH (considering). Usual recovery discussed. If bleeding becomes persistent over the school year, may consider IUD or repeat ablation. Have concerns about ease of this given prior Novasure.

## 2024-07-24 NOTE — Progress Notes (Signed)
 RGYN here for AUB had ablation 2020 or 2021.  May had spotting then a period in June bright red, tissue x 3 days. Nothing in July   Has U/S scheduled for Sunday. Want to discuss surgery.

## 2024-07-24 NOTE — Progress Notes (Signed)
   Subjective:    Patient ID: April Salas is a 38 y.o. adult presenting with Vaginal Bleeding  on 07/24/2024  HPI: Uterine ablation in 2020 or 2021. Done with Dr. Sarrah. Novasure. Had polyps and had a lot of dysmenorrhea. Pain thought to be related to prostaglandin release.  No cycle for some time following this. Has u/s scheduled for Sunday. May had spotting. In June full blown period. 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  Dr. Eldonna started on Micronor . They stopped this due to depression.  Thinking of starting low dose T, likely not in a dose high enough to suppress cycle.   Review of Systems  Constitutional:  Negative for chills and fever.  Respiratory:  Negative for shortness of breath.   Cardiovascular:  Negative for chest pain.  Gastrointestinal:  Negative for abdominal pain, nausea and vomiting.  Genitourinary:  Negative for dysuria.  Skin:  Negative for rash.      Objective:    BP 111/78   Pulse 67  Physical Exam Exam conducted with a chaperone present.  Constitutional:      General: [place] is not in acute distress.    Appearance: [place] is well-developed.  HENT:     Head: Normocephalic and atraumatic.  Eyes:     General: No scleral icterus. Cardiovascular:     Rate and Rhythm: Normal rate.  Pulmonary:     Effort: Pulmonary effort is normal.  Abdominal:     Palpations: Abdomen is soft.  Musculoskeletal:     Cervical back: Neck supple.  Skin:    General: Skin is warm and dry.  Neurological:     Mental Status: [place] is alert and oriented to person, place, and time.         Assessment & Plan:   Problem List Items Addressed This Visit       Unprioritized   EDS (Ehlers-Danlos syndrome) - Primary   This contributes to trouble healing and may impact their decision to have hysterectomy.      Abnormal uterine bleeding (AUB)   Discussed options, to include, meds, IUD, repeat ablation or hysterectomy. I do TVH and they do not want ovarian removal.  Discussed R/B of each. 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 They, also, have TVUS scheduled on Sunday, which may or may not shed light on pathology (likely ablation failure, due to age). They have not had a cycle in July or August as yet. They may see how things go and wait on any treatment. Would give vaginal estrogen for several weeks prior to [facility] or [facility]/RATH (considering). Usual recovery discussed. If bleeding becomes persistent over the school year, may consider IUD or repeat ablation. Have concerns about ease of this given prior Novasure.        Return if symptoms worsen or fail to improve.  Glenys GORMAN Birk, MD 07/24/2024 10:43 AM

## 2024-07-24 NOTE — Assessment & Plan Note (Signed)
 This contributes to trouble healing and may impact their decision to have hysterectomy.

## 2024-07-27 ENCOUNTER — Ambulatory Visit (HOSPITAL_BASED_OUTPATIENT_CLINIC_OR_DEPARTMENT_OTHER)

## 2024-10-14 ENCOUNTER — Ambulatory Visit

## 2024-10-14 ENCOUNTER — Other Ambulatory Visit (HOSPITAL_COMMUNITY)
Admission: RE | Admit: 2024-10-14 | Discharge: 2024-10-14 | Disposition: A | Discharge status: Home / Self Care | Source: Ambulatory Visit | Attending: Obstetrics & Gynecology | Admitting: Obstetrics & Gynecology

## 2024-10-14 DIAGNOSIS — N898 Other specified noninflammatory disorders of vagina: Secondary | ICD-10-CM | POA: Diagnosis present

## 2024-10-14 DIAGNOSIS — Z113 Encounter for screening for infections with a predominantly sexual mode of transmission: Secondary | ICD-10-CM

## 2024-10-15 ENCOUNTER — Ambulatory Visit: Payer: Self-pay | Admitting: Obstetrics & Gynecology

## 2024-10-15 DIAGNOSIS — B9689 Other specified bacterial agents as the cause of diseases classified elsewhere: Secondary | ICD-10-CM

## 2024-10-15 LAB — RPR+HBSAG+HCVAB+...
HIV Screen 4th Generation wRfx: NONREACTIVE
Hep C Virus Ab: NONREACTIVE
Hepatitis B Surface Ag: NEGATIVE
RPR Ser Ql: NONREACTIVE

## 2024-10-15 LAB — CERVICOVAGINAL ANCILLARY ONLY
Bacterial Vaginitis (gardnerella): POSITIVE — AB
Candida Glabrata: NEGATIVE
Candida Vaginitis: NEGATIVE
Chlamydia: NEGATIVE
Comment: NEGATIVE
Comment: NEGATIVE
Comment: NEGATIVE
Comment: NEGATIVE
Comment: NEGATIVE
Comment: NORMAL
Neisseria Gonorrhea: NEGATIVE
Trichomonas: NEGATIVE

## 2024-10-15 MED ORDER — METRONIDAZOLE 500 MG PO TABS: 500.0000 mg | ORAL_TABLET | Freq: Two times a day (BID) | ORAL | 0 refills | Status: DC

## 2024-10-15 NOTE — Progress Notes (Signed)
 SUBJECTIVE:  38 y.o. female who desires a STI screen. Denies abnormal vaginal discharge, bleeding or significant pelvic pain. No UTI symptoms. Denies history of known exposure to STD.  No LMP recorded. Patient has had an ablation.  OBJECTIVE:  She appears well.   ASSESSMENT:  STI Screen   PLAN:  Pt offered STI blood screening-requested GC, chlamydia, and trichomonas, BV , Yeast probe (Full testing  requested by patient )sent to lab.  Treatment: To be determined once lab results are received.  Pt follow up as needed.

## 2024-10-25 ENCOUNTER — Other Ambulatory Visit: Payer: Self-pay

## 2024-10-25 ENCOUNTER — Encounter (HOSPITAL_BASED_OUTPATIENT_CLINIC_OR_DEPARTMENT_OTHER): Payer: Self-pay

## 2024-10-25 ENCOUNTER — Emergency Department (HOSPITAL_BASED_OUTPATIENT_CLINIC_OR_DEPARTMENT_OTHER): Admitting: Radiology

## 2024-10-25 ENCOUNTER — Emergency Department (HOSPITAL_BASED_OUTPATIENT_CLINIC_OR_DEPARTMENT_OTHER)

## 2024-10-25 ENCOUNTER — Emergency Department (HOSPITAL_BASED_OUTPATIENT_CLINIC_OR_DEPARTMENT_OTHER)
Admission: EM | Admit: 2024-10-25 | Discharge: 2024-10-25 | Disposition: A | Discharge status: Home / Self Care | Attending: Emergency Medicine | Admitting: Emergency Medicine

## 2024-10-25 DIAGNOSIS — Z79890 Hormone replacement therapy (postmenopausal): Secondary | ICD-10-CM | POA: Insufficient documentation

## 2024-10-25 DIAGNOSIS — E039 Hypothyroidism, unspecified: Secondary | ICD-10-CM | POA: Insufficient documentation

## 2024-10-25 DIAGNOSIS — R079 Chest pain, unspecified: Secondary | ICD-10-CM | POA: Insufficient documentation

## 2024-10-25 LAB — RESP PANEL BY RT-PCR (RSV, FLU A&B, COVID)  RVPGX2
Influenza A by PCR: NEGATIVE
Influenza B by PCR: NEGATIVE
Resp Syncytial Virus by PCR: NEGATIVE
SARS Coronavirus 2 by RT PCR: NEGATIVE

## 2024-10-25 LAB — PRO BRAIN NATRIURETIC PEPTIDE: Pro Brain Natriuretic Peptide: <50 pg/mL (ref <300.0)

## 2024-10-25 LAB — COMPREHENSIVE METABOLIC PANEL WITH GFR
ALT: 17 U/L (ref 0–44)
AST: 17 U/L (ref 15–41)
Albumin: 4.3 g/dL (ref 3.5–5.0)
Alkaline Phosphatase: 66 U/L (ref 38–126)
Anion gap: 15 (ref 5–15)
BUN: 13 mg/dL (ref 6–20)
CO2: 19 mmol/L — ABNORMAL LOW (ref 22–32)
Calcium: 9.4 mg/dL (ref 8.9–10.3)
Chloride: 106 mmol/L (ref 98–111)
Creatinine, Ser: 0.78 mg/dL (ref 0.44–1.00)
GFR, Estimated: >60 mL/min (ref >60)
Glucose, Bld: 103 mg/dL — ABNORMAL HIGH (ref 70–99)
Potassium: 3.7 mmol/L (ref 3.5–5.1)
Sodium: 139 mmol/L (ref 135–145)
Total Bilirubin: 0.2 mg/dL (ref 0.0–1.2)
Total Protein: 6.8 g/dL (ref 6.5–8.1)

## 2024-10-25 LAB — TROPONIN T, HIGH SENSITIVITY
Troponin T High Sensitivity: <15 ng/L (ref 0–19)
Troponin T High Sensitivity: <15 ng/L (ref 0–19)

## 2024-10-25 LAB — HCG, SERUM, QUALITATIVE: Preg, Serum: NEGATIVE

## 2024-10-25 LAB — D-DIMER, QUANTITATIVE: D-Dimer, Quant: 0.52 ug{FEU}/mL — ABNORMAL HIGH (ref 0.00–0.50)

## 2024-10-25 LAB — LIPASE, BLOOD: Lipase: 44 U/L (ref 11–51)

## 2024-10-25 LAB — TSH: TSH: 1.23 u[IU]/mL (ref 0.350–4.500)

## 2024-10-25 LAB — MAGNESIUM: Magnesium: 2 mg/dL (ref 1.7–2.4)

## 2024-10-25 MED ORDER — LACTATED RINGERS IV BOLUS
1000.0000 mL | Freq: Once | INTRAVENOUS | Status: AC
  Administered 2024-10-25: 1000 mL via INTRAVENOUS

## 2024-10-25 MED ORDER — IOHEXOL 350 MG/ML SOLN
100.0000 mL | Freq: Once | INTRAVENOUS | Status: AC | PRN
  Administered 2024-10-25: 75 mL via INTRAVENOUS

## 2024-10-25 MED ORDER — SODIUM CHLORIDE 0.9 % IV SOLN
25.0000 mg | Freq: Four times a day (QID) | INTRAVENOUS | Status: DC | PRN
  Administered 2024-10-25: 25 mg via INTRAVENOUS
  Filled 2024-10-25: qty 1

## 2024-10-25 MED ORDER — PROMETHAZINE HCL 25 MG/ML IJ SOLN
INTRAMUSCULAR | Status: AC
  Filled 2024-10-25: qty 1

## 2024-10-25 NOTE — Discharge Instructions (Addendum)
 April Salas  Thank you for allowing us  to take care of you today.  You came to the Emergency Department today because you had chest pain, shortness of breath and palpitations and intermittently elevated heart rate.  Here in the emergency department we did multiple tests including your thyroid  hormone which is normal, your blood salts were reassuring, your blood counts were normal.  Your heart enzymes were normal twice.  Your blood clot risk factor number was mildly elevated, so we got a CT, we are not seeing any evidence of a blood clot.  Your symptoms are still present, however your workup is reassuring against emergency causes of your symptoms, therefore you are safe to follow-up outpatient with your primary care doctor as well as a cardiologist.  Will give you referral to an outpatient cardiologist.  If you do not hear from cardiology with 72 hours, you can call them back at [phone].  To-Do: 1. Please follow-up with your primary doctor within 1 - 2 weeks / as soon as possible.   Please return to the Emergency Department or call 911 if you experience have worsening of your symptoms, or do not get better, new or different chest pain, shortness of breath, severe or significantly worsening pain, high fever, severe confusion, pass out or have any reason to think that you need emergency medical care.   We hope you feel better soon.   Mitzie Later, MD Department of Emergency Medicine [facility]

## 2024-10-25 NOTE — ED Provider Notes (Signed)
 [facility] [facility] DEPART[facility] AT [facility] Provider Note   CSN: [id number] Arrival date & time: 10/25/24  1644     History Chief Complaint  Patient presents with   Chest Pain   HPI: April Salas is a 38 y.o. adult with history perinent Ehlers-Danlos syndrome, hypothyroidism, PTSD, and likely POTS per patient who presents complaining of chest pain and shortness of breath. Patient arrived via POV accompanied by partner, Arlean.  History provided by patient.  No interpreter required during this encounter.  Patient reports that over the past several weeks she has had intermittent left-sided chest pain as well as shortness of breath.  Reports that these episodes occur with exertion as well as at rest, no predictable inciting factor.  Reports that she will have occasional nausea, however has not had vomiting in recent days.  Reports that she did have loose stools yesterday.  Denies fever, chills.  Reports that she has had some episode over the past 24 hours where her heart rate will increase to the 120s to 130s before improving spontaneously.  Patient's recorded medical, surgical, social, medication list and allergies were reviewed in the Snapshot window as part of the initial history.   Prior to Admission medications   Medication Sig Start Date End Date Taking? Authorizing Provider  b complex vitamins capsule Take 1 capsule by mouth daily.    [provider]  Boric Acid [place] 600 mg vaginally at bedtime. Use vaginally every night for two weeks then twice a week 05/27/24   Eldonna Suzen Octave, MD  clonazePAM (KLONOPIN) 0.5 MG disintegrating tablet  04/07/22   [provider]  cyclobenzaprine  (FLEXERIL ) 10 MG tablet Take 1 tablet (10 mg total) by mouth 2 (two) times daily as needed for muscle spasms (daily). 05/23/24   Eldonna Suzen Octave, MD  DULoxetine (CYMBALTA) 30 MG capsule Take 60 mg by mouth daily. 06/13/21   [provider]  famotidine  (PEPCID) 40 MG tablet Take 40 mg by mouth at bedtime.    [provider]  gabapentin (NEURONTIN) 300 MG capsule Take 300 mg by mouth 2 (two) times daily as needed. Patient not taking: Reported on 05/23/2024    [provider]  Levothyroxine Sodium 125 MCG CAPS Take 125 mcg by mouth daily before breakfast.    [provider]  metFORMIN (GLUCOPHAGE-XR) 750 MG 24 hr tablet Take 1,500 mg by mouth. 06/19/23 08/25/24  [provider]  metroNIDAZOLE  (FLAGYL ) 500 MG tablet Take 1 tablet (500 mg total) by mouth 2 (two) times daily. 10/15/24   Anyanwu, Ugonna A, MD  norethindrone  (MICRONOR ) 0.35 MG tablet Take 1 tablet (0.35 mg total) by mouth daily. 05/23/24   Eldonna Suzen Octave, MD  Omega-3 Fatty Acids (FISH OIL PO) Take by mouth.    [provider]  oxyCODONE -acetaminophen  (PERCOCET/ROXICET) 5-325 MG tablet Take 1 tablet by mouth every 6 (six) hours as needed for severe pain. Patient not taking: Reported on 05/23/2024 05/22/23   Zelaya, Oscar A, PA-C  promethazine -dextromethorphan (PROMETHAZINE -DM) 6.25-15 MG/5ML syrup Take 5 mLs by mouth at bedtime as needed for cough. Patient not taking: Reported on 05/23/2024 01/22/23   Enedelia Dorna HERO, FNP  topiramate (TOPAMAX) 50 MG tablet  10/22/23   [provider]  Turmeric (QC TUMERIC COMPLEX PO) Take by mouth. Patient not taking: Reported on 05/23/2024    [provider]  Vitamin D, Ergocalciferol, (DRISDOL) 1.25 MG (50000 UNIT) CAPS capsule Take by mouth. Patient not taking: Reported on 05/23/2024 01/13/21  [provider]     Allergies: Hydrocodone-acetaminophen , Ondansetron , and Hydrocodone   Review of Systems   ROS as per HPI  Physical Exam Updated Vital Signs BP 114/70   Pulse 74   Temp 98 F (36.7 C) (Oral)   Resp 14   Ht 5' 6 (1.676 m)   Wt 90.7 kg   SpO2 100%   BMI 32.28 kg/m  Physical Exam Vitals and nursing note reviewed.  Constitutional:      General: April Salas is  not in acute distress.    Appearance: April Salas is well-developed.  HENT:     Head: Normocephalic and atraumatic.  Eyes:     Conjunctiva/sclera: Conjunctivae normal.  Cardiovascular:     Rate and Rhythm: Normal rate and regular rhythm.     Pulses:          Radial pulses are 2+ on the right side and 2+ on the left side.     Heart sounds: No murmur heard. Pulmonary:     Effort: Pulmonary effort is normal. No respiratory distress.     Breath sounds: Normal breath sounds.  Abdominal:     Palpations: Abdomen is soft.     Tenderness: There is no abdominal tenderness.  Musculoskeletal:        General: No swelling.     Cervical back: Neck supple.     Right lower leg: No tenderness. No edema.     Left lower leg: No tenderness. No edema.  Skin:    General: Skin is warm and dry.     Capillary Refill: Capillary refill takes less than 2 seconds.  Neurological:     Mental Status: April Salas is alert.  Psychiatric:        Mood and Affect: Mood normal.     ED Course/ Medical Decision Making/ A&P    Procedures Procedures   Medications Ordered in ED Medications  promethazine  (PHENERGAN ) 25 mg in sodium chloride  0.9 % 50 mL IVPB (0 mg Intravenous Stopped 10/25/24 1815)  promethazine  (PHENERGAN ) 25 MG/ML injection (  Not Given 10/25/24 1804)  lactated ringers  bolus 1,000 mL ( Intravenous Stopped 10/25/24 1848)  iohexol  (OMNIPAQUE ) 350 MG/ML injection 100 mL (75 mLs Intravenous Contrast Given 10/25/24 1958)    Medical Decision Making:   April Salas is a 38 y.o. adult who presents for chest pain and shortness of breath as per above.  Physical exam is pertinent for no focal abnormalities.   The differential includes but is not limited to viral infection, electrolyte derangement, thyroid  abnormality, anxiety, dehydration, pancreatitis, ACS, arrhythmia, pericardial tamponade, pericarditis, myocarditis, pneumonia, pneumothorax, esophageal, tear, perforated abdominal viscous, pulmonary embolism,  aortic dissection, costochondritis, musculoskeletal chest wall pain, GERD.  Independent historian: None  External data reviewed: No pertinent external data  Initial Plan:  Lipase to evaluate for pancreatitis in the setting of nausea Screening hCG given will obtain chest x-ray which uses radiation D-dimer to evaluate for PE Magnesium for dehydration LR bolus for rehydration TSH to evaluate for thyroid  abnormality BNP to further assess volume status Screening labs including CBC and Metabolic panel to evaluate for infectious or metabolic etiology of disease.  CXR to evaluate for structural/infectious intrathoracic pathology.  EKG and serial troponin to evaluate for cardiac pathology. Objective evaluation as below reviewed   Labs: Ordered, Independent interpretation, and Details: Serum hCG negative. D-dimer mildly elevated at 0.52.  TSH WNL.  CBC without leukocytosis, anemia, thrombocytopenia.  CMP without AKI, emergent electrolyte derangement, emergent LFT abnormality.  Magnesium WNL.  BNP WNL.  COVID/flu/RSV negative.  Initial troponin undetectable, delta undetectable.  Lipase negative.  Radiology: Ordered, Independent interpretation, Details: Chest x-ray without focal airspace opacification, cardiomediastinal silhouette gentian, pneumothorax, pleural effusion, bony derangement.  CT PE study without large central PE., and All images reviewed independently.  Agree with radiology report at this time.   CT Angio Chest PE W and/or Wo Contrast Result Date: 10/25/2024 EXAM: CTA CHEST 10/25/2024 07:58:09 PM TECHNIQUE: CTA of the chest was performed without and with the administration of 75 mL of intravenous contrast (iohexol  (OMNIPAQUE ) 350 MG/ML injection 100 mL IOHEXOL  350 MG/ML SOLN). Multiplanar reformatted images are provided for review. MIP images are provided for review. Automated exposure control, iterative reconstruction, and/or weight based adjustment of the mA/kV was utilized to reduce the  radiation dose to as low as reasonably achievable. COMPARISON: PA and lateral chest today. CTA chest 03/11/2022. CLINICAL HISTORY: Pulmonary embolism (PE) suspected, low to intermediate prob, positive D-dimer. Chest pain and shortness of breath intermittently times 2 weeks. FINDINGS: PULMONARY ARTERIES: Pulmonary arteries are adequately opacified for evaluation. No acute pulmonary embolus is seen . Main pulmonary artery is normal in caliber. MEDIASTINUM: The heart is slightly enlarged. There is no acute abnormality of the thoracic aorta. There is normal great vessel branching and opacification. LYMPH NODES: No mediastinal, hilar or axillary lymphadenopathy. LUNGS AND PLEURA: Mild passive atelectatic change in the posterior lungs is again noted. Bronchial thickening is present in the lower lobes without visible bronchial impactions. There is mild chronic elevation of the right hemidiaphragm. There is a 3 mm new tiny nodule in the left lower lobe base on series 6 axial 82. There are no further nodules, no active infiltrates. Mild mosaicism noted in both lower lobes, consistent with air trapping. There are trace pleural effusions. No pneumothorax. UPPER ABDOMEN: The liver is moderately steatotic, mildly enlarged. The spleen is slightly prominent, 13 cm in length. The gallbladder is absent with no biliary dilatation. SOFT TISSUES AND BONES: No acute bone or soft tissue abnormality. IMPRESSION: 1. No evidence of pulmonary embolism. 2. Trace pleural effusions. . 3. Slightly prominent heart. No venous dilatation or edema. 4. Bronchial thickening in the lower lobes, with lower lobe mosaic attenuation consistent with air trapping. 5. New 3 mm solid pulmonary nodule in the left lower lobe base; per Fleischner Society Guidelines for a solid nodule 5 mm, no routine follow-up is recommended in low-risk or unknown-risk patients without high-risk nodule features. 6. . Moderate Hepatic steatosis . Electronically signed by: Francis Quam MD 10/25/2024 08:20 PM EST RP Workstation: [id number]   DG Chest 2 View Result Date: 10/25/2024 CLINICAL DATA:  Chest pain and shortness of breath. EXAM: CHEST - 2 VIEW COMPARISON:  03/11/2022, 01/15/2017. FINDINGS: The heart size and mediastinal contours are within normal limits. No consolidation, effusion, or pneumothorax is seen. No acute osseous abnormality. IMPRESSION: No active cardiopulmonary disease. Electronically Signed   By: Leita Birmingham M.D.   On: 10/25/2024 17:47    EKG/Medicine tests: Ordered and Independent interpretation EKG Interpretation Date/Time:  Saturday October 25 2024 16:54:56 EST Ventricular Rate:  84 PR Interval:  171 QRS Duration:  101 QT Interval:  383 QTC Calculation: 453 R Axis:   -31  Text Interpretation: Sinus rhythm Left axis deviation , new Nonspecific ST and T wave abnormality Confirmed by Rogelia Satterfield ([id number]) on 10/25/2024 5:20:54 PM  Interventions: Phenergan , LR bolus  See the EMR for full details regarding lab and imaging results.  The ECG reveals no anatomical ischemia representing STEMI, New-Onset Arrhythmia,  or ischemic equivalent. She has been risk stratified with a HEAR score of 2. Initial troponin is undetectable; delta troponin is undetectable.  The patient's presentation, the patient being hemodynamically stable, and the ECG are not consistent with Pericardial Tamponade. The patient's pain is not positional. This in conjunction with the lack of PR depressions and ST elevations on the ECG are reassuring against Pericarditis. The patient's non-elevated troponin and ECG are also inconsistent with Myocarditis.  The CXR is unremarkable for focal airspace disease.  The patient is afebrile and denies productive cough.  Therefore, I do not suspect Pneumonia. There is no evidence of Pneumothorax on physical exam or on the CXR. CXR shows no evidence of Esophageal Tear and there is no recent intractable emesis or esophageal instrumentation.  There is no peritonitis or free air on CXR worrisome for a Perforated Abdominal Viscous.  Pulmonary Embolism is on the differential. The patient is at risk via the Revised Geneva Criteria. Therefore, we will further risk stratify the patient with a d-dimer.  This was elevated. Therefore, a CTA obtained and does not demonstrate acute PE.  The patient's pain is not tearing and it does not radiate to back. Pulses are present bilaterally in both the upper and lower extremities. CXR does not show a widened mediastinum. I have a very low suspicion for Aortic Dissection.  Patient's nausea was treated with Phenergan  with improvement.  Given nausea lipase was obtained to evaluate for potential underlying pancreatitis.  This was WNL, doubt pancreatitis.  TSH evaluated given patient's reported elevated heart rate, and this was WNL.  BNP WNL, doubt heart failure.  COVID/flu/RSV negative, therefore do not feel that this is contributing to patient's shortness of breath.  Overall, patient's presentation does not elucidate underlying pathology of symptoms, however reassuring against emergent pathologies.  Discussed this with patient and partner at bedside who expressed understanding, will refer to cardiology for further workup outpatient, also recommended following up with PCP.  Patient expressed understanding, discharged in stable condition.   Presentation is most consistent with acute complicated illness, Current presentation is complicated by underlying chronic conditions, and I did consider and rule out acute life/limb-threatening illness  Discussion of management or test interpretations with external provider(s): Not indicated  Risk Drugs:Prescription drug management  Disposition: DISCHARGE: I believe that the patient is safe for discharge home with outpatient follow-up. Patient was informed of all pertinent physical exam, laboratory, and imaging findings. Patient's suspected etiology of their symptom  presentation was discussed with the patient and all questions were answered. We discussed following up with PCP, cardiology. I provided thorough ED return precautions. The patient feels safe and comfortable with this plan.  MDM generated using voice dictation software and may contain dictation errors.  Please contact me for any clarification or with any questions.  Clinical Impression:  1. Chest pain, unspecified type      Discharge   Final Clinical Impression(s) / ED Diagnoses Final diagnoses:  Chest pain, unspecified type    Rx / DC Orders ED Discharge Orders          Ordered    Ambulatory referral to Cardiology       Comments: If you have not heard from the Cardiology office within the next 72 hours please call [phone].   10/25/24 2135             Rogelia Jerilynn RAMAN, MD 10/25/24 [phone]

## 2024-10-25 NOTE — ED Triage Notes (Signed)
 Chest pain and SOB intermittently for a couple of weeks and worsened over the last couple of day. Left dull constant pain with intermittent stabbing pain. Nausea. Denies vomiting. Diarrhea yesterday. Denies melena, dysuria, or hematuria.

## 2024-10-27 LAB — CBC WITH DIFFERENTIAL/PLATELET
Abs Immature Granulocytes: 0.5 K/uL — AB (ref 0.00–0.07)
Band Neutrophils: 0 %
Basophils Absolute: 0.4 K/uL — AB (ref 0.0–0.1)
Basophils Relative: 0 %
Blasts: 0 %
Eosinophils Absolute: 1.4 K/uL — AB (ref 0.0–0.5)
Eosinophils Relative: 0 %
HCT: 41 % (ref 36.0–46.0)
Hemoglobin: 14.4 g/dL (ref 12.0–15.0)
Immature Granulocytes: 1 %
Lymphocytes Relative: 28 %
Lymphs Abs: 27.5 K/uL — AB (ref 0.7–4.0)
MCH: 30.6 pg (ref 26.0–34.0)
MCHC: 35.1 g/dL (ref 30.0–36.0)
MCV: 87 fL (ref 80.0–100.0)
Metamyelocytes Relative: 0 %
Monocytes Absolute: 7.3 K/uL — AB (ref 0.1–1.0)
Monocytes Relative: 7 %
Myelocytes: 0 %
Neutro Abs: 62.9 K/uL — AB (ref 1.7–7.7)
Neutrophils Relative %: 64 %
Other: 0 %
Platelets: 260 K/uL (ref 150–400)
Promyelocytes Relative: 0 %
RBC: 4.71 MIL/uL (ref 3.87–5.11)
RDW: 12.9 % (ref 11.5–15.5)
WBC: 10 K/uL (ref 4.0–10.5)
nRBC: 0 % (ref 0.0–0.2)
nRBC: 0 /100{WBCs}

## 2024-10-29 NOTE — Progress Notes (Signed)
 Cardiology Office Note   Date:  11/03/2024  ID:  April Salas, DOB 07-04-1986, MRN [id number] PCP: Corlis Pagan, NP  [facility] Providers Cardiologist:  None     PMH Shortness of breath Mertha Danlos syndrome Long COVID  Her to cardiology and seen by Dr. Kate 03/17/2022 for evaluation of shortness of breath.  Seen in ED 03/11/2022 with chest pain and shortness of breath since COVID infection.  CTPA was done which was unremarkable, troponins negative x 2.  Since January 2022 she has had COVID infection 4 times.  She has struggled with dyspnea and palpitations since that time.  Palpitations occurring daily and lasting for seconds then resolving.  Short of breath with minimal exertion, get lightheaded with standing.  No syncope.  No known family history of heart disease. Echo 04/03/2022 revealed normal LVEF 60-65%, no rwma, diastolic parameters normal, mild MR. Zio monitor was mentioned but not completed.   ED visit 10/25/24 for chest pain and shortness of breath.  She reports HR 120s to 130s, improving spontaneously.  EKG sinus rhythm, nonspecific ST-T wave abnormality not previously seen.  CXR unremarkable.  CTA for evaluation of pulmonary embolism was negative for acute PE, aortic dissection.  Troponin negative x 2. BNP normal. Advised to f/u with cardiology.   History of Present Illness Discussed the use of AI scribe software for clinical note transcription with the patient, who gave verbal consent to proceed.  History of Present Illness April Salas is a very pleasant 38 year old who is here today for follow-up of chest pain and shortness of breath.  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  Tests were negative for COVID, however symptoms have been similar. In the summer months, was in the [place] and was exercising  consistently completing daily hikes that were very strenuous.  No chest pain, shortness of breath, palpitations, or other concerning symptoms associated with exercise during this time. Since early October 2025, they have noted increase in feeling heart race after physical exertion, accompanied by dizziness, lightheadedness, and nausea, suggesting increased dysautonomia. Also noting SOB and chest discomfort with exertion and at times with rest. Has suspected for many years that they have POTS but have experienced no syncope.  Wears compression stockings and regularly takes electrolyte replacement for management of the symptoms.  Family history includes cardiac issues of mitral valve prolapse in their father and siblings, multiple heart surgeries in their uncle, and their grandfather's history of heart attacks and congestive heart failure. On metformin for long COVID-related fatigue and brain fog which provided significant improvement. Is concerned about Lamictal and Topamax which may cause arrhythmias, however no prolonged QT intervals have been observed.  Continues to be active with walking the dog daily and gardening.  Will note episodes of HR increased to the 120s during rest, but no significant symptoms during activity.  They work as a education officer, community at [employer].   ROS: See HPI  Studies Reviewed      No results found for: LIPOA  Risk Assessment/Calculations           Physical Exam VS:  BP 120/72 (BP Location: Left Arm, Patient Position: Sitting, Cuff Size: Normal)   Pulse 73   Ht 5' 6 (1.676 m)   SpO2 96%   BMI 32.28 kg/m    Wt Readings from Last 3 Encounters:  10/25/24 200  lb (90.7 kg)  05/22/23 217 lb (98.4 kg)  08/08/22 230 lb (104.3 kg)    GEN: Well nourished, well developed in no acute distress NECK: No JVD; No carotid bruits CARDIAC: RRR, no murmurs, rubs, gallops RESPIRATORY:  Clear to auscultation without rales, wheezing or rhonchi  ABDOMEN: Soft, non-tender,  non-distended EXTREMITIES:  No edema; No deformity   Assessment & Plan Palpitations Chest pain and shortness of breath   Mitral regurgitation Symptoms of chest pain and SOB have worsened following a recent viral illness, with heart rate spikes that persist despite rest. Feels increased palpitations associated with activity. Recent ED visit with negative troponin x 2, EKG with no acute abnormality, and no acute finding on CT. Heart enlargement was mentioned on CT. We discussed management of beta blockers or calcium channel blockers for heart rate control, however BP is generally soft. Previously wore heart monitor prescribed by PCP but I have not been able to review results. They report no acute findings. Prior TTE 04/03/22 with normal LVEF, mild MR, mildly dilated LA.  - 14-day cardiac monitor to evaluate for arrhythmia - Consider a low-dose beta blocker or calcium channel blocker for heart rate control if needed - Echo to evaluate heart and valve function   Dysautonomia  Long COVID syndrome   Persistent fatigue and brain fog have been present since 2021, worsened by a recent viral illness. Felt to be the result of Long COVID possibly worsened by dysautonomia.  Metformin has been beneficial for symptom management. 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 They have been researching and found a potential link between hypermobile EDS, and long COVID also suggesting a genetic link with fibromyalgia. - Continue metformin - Recommend continued regular physical activity avoiding prolonged lying or sitting  Left kidney pain    Left kidney pain causing return visit to ED. Noted simple cyst on CT 10/30/24. Rare bacteria were found in urine, and they are currently on cephalexin . They prefer Tylenol  over opioids for pain management. Continue cephalexin  as prescribed.  - Consult PCP for further evaluation if pain persists         Dispo: 3 months with  me  Signed, Rosaline Bane, NP-C

## 2024-10-30 ENCOUNTER — Emergency Department (HOSPITAL_BASED_OUTPATIENT_CLINIC_OR_DEPARTMENT_OTHER)

## 2024-10-30 ENCOUNTER — Encounter (HOSPITAL_BASED_OUTPATIENT_CLINIC_OR_DEPARTMENT_OTHER): Payer: Self-pay | Admitting: Emergency Medicine

## 2024-10-30 ENCOUNTER — Emergency Department (HOSPITAL_BASED_OUTPATIENT_CLINIC_OR_DEPARTMENT_OTHER)
Admission: EM | Admit: 2024-10-30 | Discharge: 2024-10-30 | Disposition: A | Discharge status: Home / Self Care | Attending: Emergency Medicine | Admitting: Emergency Medicine

## 2024-10-30 ENCOUNTER — Other Ambulatory Visit: Payer: Self-pay

## 2024-10-30 DIAGNOSIS — R10A2 Flank pain, left side: Secondary | ICD-10-CM | POA: Diagnosis present

## 2024-10-30 DIAGNOSIS — R1032 Left lower quadrant pain: Secondary | ICD-10-CM | POA: Insufficient documentation

## 2024-10-30 LAB — CBC WITH DIFFERENTIAL/PLATELET
Abs Immature Granulocytes: 0.04 K/uL (ref 0.00–0.07)
Basophils Absolute: 0 K/uL (ref 0.0–0.1)
Basophils Relative: 0 %
Eosinophils Absolute: 0.2 K/uL (ref 0.0–0.5)
Eosinophils Relative: 2 %
HCT: 40.7 % (ref 36.0–46.0)
Hemoglobin: 13.9 g/dL (ref 12.0–15.0)
Immature Granulocytes: 0 %
Lymphocytes Relative: 36 %
Lymphs Abs: 3.5 K/uL (ref 0.7–4.0)
MCH: 30 pg (ref 26.0–34.0)
MCHC: 34.2 g/dL (ref 30.0–36.0)
MCV: 87.7 fL (ref 80.0–100.0)
Monocytes Absolute: 0.7 K/uL (ref 0.1–1.0)
Monocytes Relative: 8 %
Neutro Abs: 5.3 K/uL (ref 1.7–7.7)
Neutrophils Relative %: 54 %
Platelets: 403 K/uL — ABNORMAL HIGH (ref 150–400)
RBC: 4.64 MIL/uL (ref 3.87–5.11)
RDW: 13.1 % (ref 11.5–15.5)
WBC: 9.8 K/uL (ref 4.0–10.5)
nRBC: 0 % (ref 0.0–0.2)

## 2024-10-30 LAB — URINALYSIS, ROUTINE W REFLEX MICROSCOPIC
Bilirubin Urine: NEGATIVE
Glucose, UA: NEGATIVE mg/dL
Hgb urine dipstick: NEGATIVE
Ketones, ur: NEGATIVE mg/dL
Nitrite: NEGATIVE
Protein, ur: NEGATIVE mg/dL
Specific Gravity, Urine: 1.007 (ref 1.005–1.030)
pH: 8 (ref 5.0–8.0)

## 2024-10-30 LAB — LIPASE, BLOOD: Lipase: 39 U/L (ref 11–51)

## 2024-10-30 LAB — COMPREHENSIVE METABOLIC PANEL WITH GFR
ALT: 23 U/L (ref 0–44)
AST: 19 U/L (ref 15–41)
Albumin: 4.3 g/dL (ref 3.5–5.0)
Alkaline Phosphatase: 70 U/L (ref 38–126)
Anion gap: 12 (ref 5–15)
BUN: 9 mg/dL (ref 6–20)
CO2: 22 mmol/L (ref 22–32)
Calcium: 9.6 mg/dL (ref 8.9–10.3)
Chloride: 108 mmol/L (ref 98–111)
Creatinine, Ser: 0.8 mg/dL (ref 0.44–1.00)
GFR, Estimated: >60 mL/min (ref >60)
Glucose, Bld: 97 mg/dL (ref 70–99)
Potassium: 3.9 mmol/L (ref 3.5–5.1)
Sodium: 142 mmol/L (ref 135–145)
Total Bilirubin: 0.2 mg/dL (ref 0.0–1.2)
Total Protein: 6.8 g/dL (ref 6.5–8.1)

## 2024-10-30 LAB — PREGNANCY, URINE: Preg Test, Ur: NEGATIVE

## 2024-10-30 MED ORDER — IOHEXOL 300 MG/ML  SOLN
100.0000 mL | Freq: Once | INTRAMUSCULAR | Status: AC | PRN
  Administered 2024-10-30: 100 mL via INTRAVENOUS

## 2024-10-30 MED ORDER — PROMETHAZINE HCL 25 MG PO TABS
25.0000 mg | ORAL_TABLET | Freq: Once | ORAL | Status: AC
  Administered 2024-10-30: 25 mg via ORAL
  Filled 2024-10-30: qty 1

## 2024-10-30 MED ORDER — CEPHALEXIN 500 MG PO CAPS: 500.0000 mg | ORAL_CAPSULE | Freq: Four times a day (QID) | ORAL | 0 refills | Status: AC

## 2024-10-30 MED ORDER — MAGNESIUM SULFATE 2 GM/50ML IV SOLN
2.0000 g | Freq: Once | INTRAVENOUS | Status: AC
  Administered 2024-10-30: 2 g via INTRAVENOUS
  Filled 2024-10-30: qty 50

## 2024-10-30 MED ORDER — KETOROLAC TROMETHAMINE 15 MG/ML IJ SOLN
15.0000 mg | Freq: Once | INTRAMUSCULAR | Status: AC
  Administered 2024-10-30: 15 mg via INTRAVENOUS
  Filled 2024-10-30: qty 1

## 2024-10-30 MED ORDER — MORPHINE SULFATE (PF) 4 MG/ML IV SOLN
4.0000 mg | Freq: Once | INTRAVENOUS | Status: AC
  Administered 2024-10-30: 4 mg via INTRAVENOUS
  Filled 2024-10-30: qty 1

## 2024-10-30 MED ORDER — OXYCODONE HCL 5 MG PO TABS: 5.0000 mg | ORAL_TABLET | ORAL | 0 refills | Status: DC | PRN

## 2024-10-30 NOTE — ED Notes (Signed)
 Pt ambulated to the bathroom without assistance.

## 2024-10-30 NOTE — ED Provider Notes (Signed)
 [place] EMERGENCY DEPARTMENT AT [facility] Provider Note   CSN: [id number] Arrival date & time: 10/30/24  1140     Patient presents with: Back Pain   April Salas is a 38 y.o. adult  presents with complaints of left flank pain that radiates to her lower abdomen x 2 days.  Not associate with any radicular symptoms.  No urinary symptoms.  Has had a few episodes of vomiting without diarrhea.  No history of nephrolithiasis.  Does have a history of cholecystectomy.    Back Pain     Past Medical History:  Diagnosis Date   Depression    Ehlers-Danlos syndrome    Melanoma (HCC)    Mononucleosis 07/29/2021   OCD (obsessive compulsive disorder)    Thyroid  disease    Vitiligo    Past Surgical History:  Procedure Laterality Date   ABLATION     FOOT SURGERY     GALLBLADDER SURGERY     REVISION OF SCAR ON FACE/HEAD     SKIN CANCER EXCISION       Prior to Admission medications   Medication Sig Start Date End Date Taking? Authorizing Provider  budesonide-formoterol (SYMBICORT) 80-4.5 MCG/ACT inhaler Inhale 2 puffs into the lungs 2 (two) times daily. 10/23/24 10/23/25 Yes [provider]  celecoxib (CELEBREX) 200 MG capsule Take 200 mg by mouth 2 (two) times daily. 12/28/23  Yes [provider]  cephALEXin  (KEFLEX ) 500 MG capsule Take 1 capsule (500 mg total) by mouth 4 (four) times daily for 10 days. 10/30/24 11/09/24 Yes Donnajean Lynwood DEL, PA-C  oxyCODONE  (ROXICODONE ) 5 MG immediate release tablet Take 1 tablet (5 mg total) by mouth every 4 (four) hours as needed for severe pain (pain score 7-10). 10/30/24  Yes Donnajean Lynwood DEL, PA-C  b complex vitamins capsule Take 1 capsule by mouth daily.    [provider]  Boric Acid CRYS Place 600 mg vaginally at bedtime. Use vaginally every night for two weeks then twice a week 05/27/24   Eldonna Suzen Octave, MD  clonazePAM (KLONOPIN) 0.5 MG disintegrating tablet  04/07/22   [provider]  cyclobenzaprine  (FLEXERIL ) 10 MG tablet Take 1 tablet (10 mg total) by mouth 2 (two) times daily as needed for muscle spasms (daily). 05/23/24   Eldonna Suzen Octave, MD  DULoxetine (CYMBALTA) 30 MG capsule Take 60 mg by mouth daily. 06/13/21   [provider]  famotidine (PEPCID) 40 MG tablet Take 40 mg by mouth at bedtime.    [provider]  gabapentin (NEURONTIN) 300 MG capsule Take 300 mg by mouth 2 (two) times daily as needed. Patient not taking: Reported on 05/23/2024    [provider]  lamoTRIgine (LAMICTAL) 150 MG tablet Take 150 mg by mouth daily.    [provider]  Levothyroxine Sodium 125 MCG CAPS Take 125 mcg by mouth daily before breakfast.    [provider]  metFORMIN (GLUCOPHAGE-XR) 750 MG 24 hr tablet Take 1,500 mg by mouth. 06/19/23 08/25/24  [provider]  metroNIDAZOLE  (FLAGYL ) 500 MG tablet Take 1 tablet (500 mg total) by mouth 2 (two) times daily. 10/15/24   Anyanwu, Ugonna A, MD  norethindrone  (MICRONOR ) 0.35 MG tablet Take 1 tablet (0.35 mg total) by mouth daily. 05/23/24   Eldonna Suzen Octave, MD  Omega-3 Fatty Acids (FISH OIL PO) Take by mouth.    [provider]  oxyCODONE -acetaminophen  (PERCOCET/ROXICET) 5-325 MG tablet Take 1 tablet by mouth every 6 (six) hours as needed for severe  pain. Patient not taking: Reported on 05/23/2024 05/22/23   Zelaya, Oscar A, PA-C  promethazine -dextromethorphan (PROMETHAZINE -DM) 6.25-15 MG/5ML syrup Take 5 mLs by mouth at bedtime as needed for cough. Patient not taking: Reported on 05/23/2024 01/22/23   Enedelia Dorna HERO, FNP  topiramate (TOPAMAX) 50 MG tablet  10/22/23   [provider]  Turmeric (QC TUMERIC COMPLEX PO) Take by mouth. Patient not taking: Reported on 05/23/2024    [provider]  Vitamin D, Ergocalciferol, (DRISDOL) 1.25 MG (50000 UNIT) CAPS capsule Take by mouth. Patient not taking: Reported on 05/23/2024 01/13/21   [provider]    Allergies: Hydrocodone-acetaminophen , Ondansetron , and Hydrocodone    Review of Systems  Musculoskeletal:  Positive for back pain.    Updated Vital Signs BP 123/78   Pulse 71   Temp 98.9 F (37.2 C) (Oral)   Resp 16   SpO2 99%   Physical Exam Vitals and nursing note reviewed.  Constitutional:      General: [name] is not in acute distress.    Appearance: [name] is well-developed.  HENT:     Head: Normocephalic and atraumatic.  Eyes:     Conjunctiva/sclera: Conjunctivae normal.  Cardiovascular:     Rate and Rhythm: Normal rate and regular rhythm.     Heart sounds: No murmur heard. Pulmonary:     Effort: Pulmonary effort is normal. No respiratory distress.     Breath sounds: Normal breath sounds.  Abdominal:     Palpations: Abdomen is soft.     Tenderness: There is abdominal tenderness.     Comments: Tender left lower quadrant with positive left CVAT  Musculoskeletal:        General: No swelling.     Cervical back: Neck supple.     Comments: no midline spinal tenderness, 5 out of 5 lower extremity strength  Skin:    General: Skin is warm and dry.     Capillary Refill: Capillary refill takes less than 2 seconds.  Neurological:     Mental Status: [name] is alert.  Psychiatric:        Mood and Affect: Mood normal.     (all labs ordered are listed, but only abnormal results are displayed) Labs Reviewed  CBC WITH DIFFERENTIAL/PLATELET - Abnormal; Notable for the following components:      Result Value   Platelets 403 (*)    All other components within normal limits  URINALYSIS, ROUTINE W REFLEX MICROSCOPIC - Abnormal; Notable for the following components:   Color, Urine COLORLESS (*)    Leukocytes,Ua MODERATE (*)    Bacteria, UA RARE (*)    All other components within normal limits  COMPREHENSIVE METABOLIC PANEL WITH GFR  LIPASE, BLOOD  PREGNANCY, URINE    EKG: None  Radiology: CT ABDOMEN PELVIS W CONTRAST Result Date: 10/30/2024 CLINICAL DATA:   Lower back pain x2 days. EXAM: CT ABDOMEN AND PELVIS WITH CONTRAST TECHNIQUE: Multidetector CT imaging of the abdomen and pelvis was performed using the standard protocol following bolus administration of intravenous contrast. RADIATION DOSE REDUCTION: This exam was performed according to the departmental dose-optimization program which includes automated exposure control, adjustment of the mA and/or kV according to patient size and/or use of iterative reconstruction technique. CONTRAST:  OMNIPAQUE  IOHEXOL  300 MG/ML  SOLN COMPARISON:  December 11, 2021 FINDINGS: Lower chest: No acute abnormality. Hepatobiliary: There is diffuse fatty infiltration of the liver parenchyma. No focal liver abnormality is seen. Status post cholecystectomy. No biliary dilatation. Pancreas: Unremarkable. No pancreatic ductal  dilatation or surrounding inflammatory changes. Spleen: Normal in size without focal abnormality. Adrenals/Urinary Tract: Adrenal glands are unremarkable. Kidneys are normal in size, without renal calculi or hydronephrosis. A 7 simple cyst is seen within the mid left kidney. Bladder is unremarkable. Stomach/Bowel: Stomach is within normal limits. Appendix appears normal. No evidence of bowel wall thickening, distention, or inflammatory changes. Noninflamed diverticula are seen scattered throughout the large bowel. Vascular/Lymphatic: No significant vascular findings are present. No enlarged abdominal or pelvic lymph nodes. Reproductive: Uterus and bilateral adnexa are unremarkable. Other: No abdominal wall hernia or abnormality. No abdominopelvic ascites. Musculoskeletal: No acute or significant osseous findings. IMPRESSION: 1. Hepatic steatosis. 2. Colonic diverticulosis. 3. Evidence of prior cholecystectomy. Electronically Signed   By: Suzen Dials M.D.   On: 10/30/2024 16:43     Procedures   Medications Ordered in the ED  magnesium  sulfate IVPB 2 g 50 mL (0 g Intravenous Stopped 10/30/24 1618)   ketorolac  (TORADOL ) 15 MG/ML injection 15 mg (15 mg Intravenous Given 10/30/24 1457)  iohexol  (OMNIPAQUE ) 300 MG/ML solution 100 mL (100 mLs Intravenous Contrast Given 10/30/24 1614)  morphine  (PF) 4 MG/ML injection 4 mg (4 mg Intravenous Given 10/30/24 1710)  promethazine  (PHENERGAN ) tablet 25 mg (25 mg Oral Given 10/30/24 1709)    Clinical Course as of 10/30/24 1740  Thu Oct 30, 2024  1427 Patient evaluated for left flank pain x 2 days with associated nausea and vomiting.  No radicular symptoms.  No red flag symptoms.  Pain does radiate to her left lower abdomen without any associated urinary symptoms.  On exam she is hemodynamically stable.  She does appear uncomfortable.  She has tenderness over the left lower quadrant with positive left CVAT.  Will obtain routine labs and CT abdomen pelvis. [JT]  1503 CBC with Diff(!) No significant abnormality [JT]  1650 Comprehensive metabolic panel Unremarkable [JT]  1650 Lipase, blood Without elevation [JT]  1650 Pregnancy, urine Negative [JT]  1650 Urinalysis, Routine w reflex microscopic -Urine, Clean Catch(!) Moderate leukocytes with rare bacteria, negative nitrites and 0-5 WBCs [JT]  1651 CT ABDOMEN PELVIS W CONTRAST No acute abnormality [JT]  1737 Workup is overall reassuring.  UA is equivocal for UTI.  Patient is adamant that her symptoms are identical to prior episode of pyelonephritis.  Pain is constant regardless of movement.  Lower suspicion for musculoskeletal etiology at this point.  Have agreed to send in a course of antibiotics to cover for possible UTI/pyelonephritis.  Encourage patient to follow-up with PCP.  She is understanding agreement plan.  Strict return precautions provided. [JT]    Clinical Course User Index [JT] Donnajean Lynwood DEL, PA-C                                 Medical Decision Making Amount and/or Complexity of Data Reviewed Labs: ordered. Decision-making details documented in ED Course. Radiology: ordered.  Decision-making details documented in ED Course.  Risk Prescription drug management.   This patient presents to the ED with chief complaint(s) of flank pain.  The complaint involves an extensive differential diagnosis and also carries with it a high risk of complications and morbidity.   Pertinent past medical history as listed in HPI  The differential diagnosis includes  Based off exam and history do not suspect aortic aneurysm, nephrolithiasis, spinal abscess, cauda equina Additional history obtained: Records reviewed Care Everywhere/External Records  Disposition:   Patient will be discharged home. The patient  has been appropriately medically screened and/or stabilized in the ED. I have low suspicion for any other emergent medical condition which would require further screening, evaluation or treatment in the ED or require inpatient management. At time of discharge the patient is hemodynamically stable and in no acute distress. I have discussed work-up results and diagnosis with patient and answered all questions. Patient is agreeable with discharge plan. We discussed strict return precautions for returning to the emergency department and they verbalized understanding.     Social Determinants of Health:   none  This note was dictated with voice recognition software.  Despite best efforts at proofreading, errors may have occurred which can change the documentation meaning.       Final diagnoses:  Left flank pain    ED Discharge Orders          Ordered    cephALEXin  (KEFLEX ) 500 MG capsule  4 times daily        10/30/24 1734    oxyCODONE  (ROXICODONE ) 5 MG immediate release tablet  Every 4 hours PRN        10/30/24 1737               Donnajean Lynwood DEL, PA-C 10/30/24 1740    Mannie Pac T, DO 10/31/24 [phone]

## 2024-10-30 NOTE — ED Notes (Signed)
 Urine sent to lab to hold till order.

## 2024-10-30 NOTE — ED Triage Notes (Signed)
 Reports severe lower back x 2 days. Denies fevers. Was seen here over weekend for CP and +ddimer. Urinary frequency.

## 2024-10-30 NOTE — Discharge Instructions (Addendum)
 You were evaluated in the emergency room for flank abdominal pain.  Your lab work and imaging did not show any significant abnormality.  Your urine was equivocal for UTI.  You are being treated empirically for a kidney infection.  Prescription for antibiotics was sent to your pharmacy.  Please be sure to complete the course of antibiotics.  You have additionally been provided a few pain pills.  You may use Tylenol  and ibuprofen  in addition to this.  If you experience any new or worsening symptoms please return to emergency room.  Otherwise please follow with your primary care doctor for further evaluation.

## 2024-11-03 ENCOUNTER — Encounter (HOSPITAL_BASED_OUTPATIENT_CLINIC_OR_DEPARTMENT_OTHER): Payer: Self-pay | Admitting: Nurse Practitioner

## 2024-11-03 ENCOUNTER — Ambulatory Visit: Attending: Nurse Practitioner

## 2024-11-03 ENCOUNTER — Ambulatory Visit (HOSPITAL_BASED_OUTPATIENT_CLINIC_OR_DEPARTMENT_OTHER): Admitting: Nurse Practitioner

## 2024-11-03 VITALS — BP 120/72 | HR 73 | Ht 66.0 in

## 2024-11-03 DIAGNOSIS — G9089 Other disorders of autonomic nervous system: Secondary | ICD-10-CM | POA: Diagnosis not present

## 2024-11-03 DIAGNOSIS — I340 Nonrheumatic mitral (valve) insufficiency: Secondary | ICD-10-CM | POA: Diagnosis not present

## 2024-11-03 DIAGNOSIS — U099 Post covid-19 condition, unspecified: Secondary | ICD-10-CM

## 2024-11-03 DIAGNOSIS — R072 Precordial pain: Secondary | ICD-10-CM | POA: Diagnosis not present

## 2024-11-03 DIAGNOSIS — R0602 Shortness of breath: Secondary | ICD-10-CM

## 2024-11-03 DIAGNOSIS — R002 Palpitations: Secondary | ICD-10-CM | POA: Diagnosis not present

## 2024-11-03 DIAGNOSIS — R10A2 Flank pain, left side: Secondary | ICD-10-CM

## 2024-11-03 NOTE — Progress Notes (Unsigned)
Enrolled patient for a 14 day Zio XT monitor to be mailed to patients home  April Salas to read

## 2024-11-03 NOTE — Patient Instructions (Signed)
 Medication Instructions:   Your physician recommends that you continue on your current medications as directed. Please refer to the Current Medication list given to you today.   *If you need a refill on your cardiac medications before your next appointment, please call your pharmacy*  Lab Work:  None ordered.  If you have labs (blood work) drawn today and your tests are completely normal, you will receive your results only by: MyChart Message (if you have MyChart) OR A paper copy in the mail If you have any lab test that is abnormal or we need to change your treatment, we will call you to review the results.  Testing/Procedures:  Your physician has requested that you have an echocardiogram. Echocardiography is a painless test that uses sound waves to create images of your heart. It provides your doctor with information about the size and shape of your heart and how well your heart's chambers and valves are working. This procedure takes approximately one hour. There are no restrictions for this procedure. Please do NOT wear cologne, perfume, aftershave, or lotions (deodorant is allowed). Please arrive 15 minutes prior to your appointment time.  ZIO XT- Long Term Monitor Instructions  Your physician has requested you wear a ZIO patch monitor for 14 days.  This is a single patch monitor. [employer] supplies one patch monitor per enrollment. Additional stickers are not available. Please do not apply patch if you will be having a Nuclear Stress Test,  Echocardiogram, Cardiac CT, MRI, or Chest Xray during the period you would be wearing the  monitor. The patch cannot be worn during these tests. You cannot remove and re-apply the  ZIO XT patch monitor.  Your ZIO patch monitor will be mailed 3 day USPS to your address on file. It may take 3-5 days  to receive your monitor after you have been enrolled.  Once you have received your monitor, please review the enclosed instructions. Your monitor   has already been registered assigning a specific monitor serial # to you.  Billing and Patient Assistance Program Information  We have supplied [employer] with any of your insurance information on file for billing purposes. [employer] offers a sliding scale Patient Assistance Program for patients that do not have  insurance, or whose insurance does not completely cover the cost of the ZIO monitor.  You must apply for the Patient Assistance Program to qualify for this discounted rate.  To apply, please call [employer] at [phone], select option 4, select option 2, ask to apply for  Patient Assistance Program. Meredeth will ask your household income, and how many people  are in your household. They will quote your out-of-pocket cost based on that information.  [employer] will also be able to set up a 98-month, interest-free payment plan if needed.  Applying the monitor   Shave hair from upper left chest.  Hold abrader disc by orange tab. Rub abrader in 40 strokes over the upper left chest as  indicated in your monitor instructions.  Clean area with 4 enclosed alcohol pads. Let dry.  Apply patch as indicated in monitor instructions. Patch will be placed under collarbone on left  side of chest with arrow pointing upward.  Rub patch adhesive wings for 2 minutes. Remove white label marked 1. Remove the white  label marked 2. Rub patch adhesive wings for 2 additional minutes.  While looking in a mirror, press and release button in center of patch. A small green light will  flash 3-4 times. This will be  your only indicator that the monitor has been turned on.  Do not shower for the first 24 hours. You may shower after the first 24 hours.  Press the button if you feel a symptom. You will hear a small click. Record Date, Time and  Symptom in the Patient Logbook.  When you are ready to remove the patch, follow instructions on the last 2 pages of Patient  Logbook. Stick patch monitor onto the last page  of Patient Logbook.  Place Patient Logbook in the blue and white box. Use locking tab on box and tape box closed  securely. The blue and white box has prepaid postage on it. Please place it in the mailbox as  soon as possible. Your physician should have your test results approximately 7 days after the  monitor has been mailed back to [facility].  Call [facility] Customer Care at [phone] if you have questions regarding  your ZIO XT patch monitor. Call them immediately if you see an orange light blinking on your  monitor.  If your monitor falls off in less than 4 days, contact our Monitor department at [phone].  If your monitor becomes loose or falls off after 4 days call [employer] at [phone] for  suggestions on securing your monitor   Follow-Up: At [facility], you and your health needs are our priority.  As part of our continuing mission to provide you with exceptional heart care, our providers are all part of one team.  This team includes your primary Cardiologist (physician) and Advanced Practice Providers or APPs (Physician Assistants and Nurse Practitioners) who all work together to provide you with the care you need, when you need it.  Your next appointment:   3 month(s)  Provider:   Rosaline Bane, NP    We recommend signing up for the patient portal called MyChart.  Sign up information is provided on this After Visit Summary.  MyChart is used to connect with patients for Virtual Visits (Telemedicine).  Patients are able to view lab/test results, encounter notes, upcoming appointments, etc.  Non-urgent messages can be sent to your provider as well.   To learn more about what you can do with MyChart, go to [email or web address].

## 2024-11-13 ENCOUNTER — Encounter: Payer: Self-pay | Admitting: Family Medicine

## 2024-11-13 ENCOUNTER — Ambulatory Visit: Admitting: Family Medicine

## 2024-11-13 VITALS — BP 112/80 | HR 79 | Ht 66.0 in

## 2024-11-13 DIAGNOSIS — R5383 Other fatigue: Secondary | ICD-10-CM | POA: Diagnosis not present

## 2024-11-13 DIAGNOSIS — Q796 Ehlers-Danlos syndrome, unspecified: Secondary | ICD-10-CM

## 2024-11-13 NOTE — Progress Notes (Signed)
   I, Leotis Batter, CMA acting as a scribe for Artist Lloyd, MD.  April Salas is a 38 y.o. adult who presents to [facility] at [facility] today for treatment/management of her EDS.   MS: Chronic joint pain, Chronic wide spread muscle pain, Joint Hypermobility, Joint Instability, and Joint Subluxation Skin/Immune reactions: Soft Velvety Skin, Poor Wound Healing, Atrophic Scars, Abnormal Stretch Marks Before Puberty / without significant weight gain , Rashes / Hives, and Medication / Chemical / Food Sensitivities  Neurological: Headache  Migraine, Syncope / Pre-Syncope, Reduced Proprioception, Clumsiness , Burning, Numbness and/or Tingling in Extremities, Cognitive Dysfunction / Brain Fog, Sleep Disturbance, and Sensory Sensitivities ANS: Dysautonomia / POTS / Orthostatic Intolerance, Fatigue, Exercise / Temperature Intolerance , and Anxiety / Panic Respiratory: Dyspnea CV: Tachycardia and Varicose Veins GI:  GERD, IBS with Diarrhea or Constipation, Gastroparesis, Painful ABD Bloating, and Frequent N/V Genitourinary: Incontinence and Tearing in the Vulvar region Hands & Feet: DENIES Piezogenic Papules   Pertinent review of systems: No fevers or chills  Relevant historical information: Hypothyroidism.   Exam:  BP 112/80   Pulse 79   Ht 5' 6 (1.676 m)   SpO2 97%   BMI 32.28 kg/m  General: Well Developed, well nourished, and in no acute distress.   MSK: Increased range of motion.    Lab and Radiology Results     Assessment and Plan: 38 y.o. 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 Plan to continue physical therapy and integrative therapies.  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  Patient is doing a good job taking care of their fatigue with CPAP another quality of life measures.  Talked about options.  Plan to check some iron studies as well.  I am concerned about iron deficiency  anemia.  Recheck in 1 month.  PDMP not reviewed this encounter. Orders Placed This Encounter  Procedures   Iron, TIBC and Ferritin Panel    Standing Status:   Future    Number of Occurrences:   1    Expiration Date:   11/13/2025   No orders of the defined types were placed in this encounter.    Discussed warning signs or symptoms. Please see discharge instructions. Patient expresses understanding.   The above documentation has been reviewed and is accurate and complete Artist Lloyd, M.D. Total encounter time 30 minutes including face-to-face time with the patient and, reviewing past medical record, and charting on the date of service.

## 2024-11-13 NOTE — Patient Instructions (Addendum)
 Thank you for coming in today.   Stop by lab before you leave.   Continue physical therapy  Check out the book Disjointed Navigating the Diagnosis and Management of Hypermobile Ehlers-Danlos Syndrome and Hypermobility Spectrum Disorders.    For POTS symptoms: Consume 3 L water and 8-12 gram of salt per day   Check out these websites for exercises to try if you have POTS ([employer] Protocol, [place] Protocol, and [employer] Protocol) Https://www.taylor-robbins.com/ https://dean.info/   Guide to Prof. Russek's Patient Self-Care Handouts https://webspace.Http://www.black-smith.org/  Orthostatic Intolerance and Postural Orthostatic Tachycardia Self-Care Checklist https://webspace.Wvlyxc.com  Steps To Managing Your Mast Cell Activation Syndrome https://webspace.Newyearsms.fi.pdf  See you back in 1 month

## 2024-11-14 ENCOUNTER — Ambulatory Visit: Payer: Self-pay | Admitting: Family Medicine

## 2024-11-14 LAB — IRON,TIBC AND FERRITIN PANEL
%SAT: 21 % (ref 16–45)
Ferritin: 19 ng/mL (ref 16–154)
Iron: 68 ug/dL (ref 40–190)
TIBC: 318 ug/dL (ref 250–450)

## 2024-11-14 NOTE — Progress Notes (Signed)
 Iron stores look a little low to me.  Ferritin is 19 which I think should be a bit higher.  Recommend taking oral iron over-the-counter once or twice daily.

## 2024-11-30 ENCOUNTER — Encounter: Payer: Self-pay | Admitting: Family Medicine

## 2024-12-02 ENCOUNTER — Other Ambulatory Visit (HOSPITAL_BASED_OUTPATIENT_CLINIC_OR_DEPARTMENT_OTHER)

## 2024-12-02 ENCOUNTER — Ambulatory Visit: Admitting: Cardiology

## 2024-12-02 DIAGNOSIS — R002 Palpitations: Secondary | ICD-10-CM | POA: Diagnosis not present

## 2024-12-02 DIAGNOSIS — I340 Nonrheumatic mitral (valve) insufficiency: Secondary | ICD-10-CM | POA: Diagnosis not present

## 2024-12-02 LAB — ECHOCARDIOGRAM COMPLETE
Area-P 1/2: 3.77 cm2
Calc EF: 59.4 %
S' Lateral: 2.43 cm
Single Plane A2C EF: 65.3 %
Single Plane A4C EF: 59.5 %

## 2024-12-03 ENCOUNTER — Ambulatory Visit (HOSPITAL_BASED_OUTPATIENT_CLINIC_OR_DEPARTMENT_OTHER): Payer: Self-pay | Admitting: Nurse Practitioner

## 2024-12-05 DIAGNOSIS — I340 Nonrheumatic mitral (valve) insufficiency: Secondary | ICD-10-CM

## 2024-12-05 DIAGNOSIS — R002 Palpitations: Secondary | ICD-10-CM

## 2024-12-11 ENCOUNTER — Encounter: Payer: Self-pay | Admitting: Family Medicine

## 2024-12-15 ENCOUNTER — Ambulatory Visit: Admitting: Family Medicine

## 2024-12-15 ENCOUNTER — Encounter: Payer: Self-pay | Admitting: Family Medicine

## 2024-12-15 ENCOUNTER — Other Ambulatory Visit

## 2024-12-15 VITALS — BP 118/70 | HR 72 | Ht 66.0 in

## 2024-12-15 DIAGNOSIS — Q796 Ehlers-Danlos syndrome, unspecified: Secondary | ICD-10-CM | POA: Diagnosis not present

## 2024-12-15 NOTE — Progress Notes (Signed)
"       ° °  I, Claretha Schimke am a scribe for Dr. Artist Lloyd, MD.  April Salas is a 39 y.o. adult who presents to [facility] at [facility] today for 47-month f/u EDS. Pt was last seen by Dr. Lloyd on 11/13/24 and was advised to cont [facility] at [facility] and her iron was checked. Based on results, iron oral supplement was advised.  Today, pt reports that the chest pain is only happening with the fluid of anxiety feelings. Got genetic testing done and have results back that was sent via mychart to Dr. Lloyd. Would like to know if gym workouts can be added back into the routine after having clear heart testing. Been having left flank pain recently but now it is gone. Would like to know if Dr. Lloyd had any information on other nerve pain issue that isn't from eternal shingles. Tried to increase the salt intake since last visit.   Pertinent review of systems: No fevers or chills  Relevant historical information: Autoimmune thyroiditis.   Exam:  BP 118/70   Pulse 72   Ht 5' 6 (1.676 m)   SpO2 99%   BMI 32.28 kg/m  General: Well Developed, well nourished, and in no acute distress.   MSK: Normal cervical motion    Lab and Radiology Results No results found for this or any previous visit (from the past 72 hours). No results found.     Assessment and Plan: 39 y.o. adult with overall doing okay.  Still some room for improvements.  We spent time talking about POTS as well as recent genetic testing.  Plan to continue conservative management strategies. Recheck in 3 months.  PDMP not reviewed this encounter. No orders of the defined types were placed in this encounter.  No orders of the defined types were placed in this encounter.    Discussed warning signs or symptoms. Please see discharge instructions. Patient expresses understanding.   The above documentation has been reviewed and is accurate and complete Artist Lloyd, M.D.   "

## 2024-12-15 NOTE — Patient Instructions (Addendum)
Thank you for coming in today.  ° °Recheck in 3 months °

## 2024-12-24 ENCOUNTER — Encounter: Payer: Self-pay | Admitting: Nurse Practitioner

## 2025-01-02 ENCOUNTER — Encounter (HOSPITAL_BASED_OUTPATIENT_CLINIC_OR_DEPARTMENT_OTHER): Payer: Self-pay | Admitting: Nurse Practitioner

## 2025-01-08 ENCOUNTER — Encounter: Payer: Self-pay | Admitting: Family Medicine

## 2025-01-08 ENCOUNTER — Other Ambulatory Visit: Payer: Self-pay | Admitting: Family Medicine

## 2025-01-08 MED ORDER — CELECOXIB 200 MG PO CAPS: 200.0000 mg | ORAL_CAPSULE | Freq: Two times a day (BID) | ORAL | 0 refills | Status: AC | PRN

## 2025-01-08 NOTE — Telephone Encounter (Signed)
 Refilled earlier today.

## 2025-01-08 NOTE — Telephone Encounter (Signed)
 Last OV 12/15/24 Next OV not scheduled  Last refill - historical provider   Renal labs 10/30/24  Component Ref Range & Units (hover) 2 mo ago (10/30/24) 2 mo ago (10/25/24) 2 yr ago (03/11/22) 3 yr ago (12/19/21) 3 yr ago (12/11/21) 3 yr ago (07/09/21)  Sodium 142 139 137 137 136 137  Potassium 3.9 3.7 3.7 4.3 4.1 3.5  Chloride 108 106 105 105 105 104  CO2 22 19 Low  19 Low  22 19 Low  22  Glucose, Bld 97 103 High  CM 103 High  CM 98 CM 127 High  CM 129 High  CM  Comment: Glucose reference range applies only to samples taken after fasting for at least 8 hours.  BUN 9 13 13 11 9 9   Creatinine, Ser 0.80 0.78 0.83 0.78 0.84 0.84  Calcium 9.6 9.4 10.2 9.3 9.4 9.0  Total Protein 6.8 6.8      Albumin 4.3 4.3      AST 19 17      ALT 23 17      Alkaline Phosphatase 70 66      Total Bilirubin 0.2 0.2      GFR, Estimated >60 >60 CM >60 CM >60 CM >60 CM >60 CM  Comment: (NOTE) Calculated using the CKD-EPI Creatinine Equation (2021)  Anion gap 12 15 CM 13 CM 10 CM 12 CM 11 CM

## 2025-01-09 NOTE — Telephone Encounter (Signed)
 Refilled 01/08/25.

## 2025-01-12 ENCOUNTER — Ambulatory Visit (HOSPITAL_BASED_OUTPATIENT_CLINIC_OR_DEPARTMENT_OTHER): Admitting: Nurse Practitioner
